# Patient Record
Sex: Male | Born: 1955 | Race: White | Hispanic: No | Marital: Married | Smoking: Former smoker
Health system: Southern US, Community
[De-identification: ages and names within clinical notes are randomized; demographics above are authoritative.]

## PROBLEM LIST (undated history)

## (undated) DIAGNOSIS — D649 Anemia, unspecified: Secondary | ICD-10-CM

## (undated) DIAGNOSIS — I714 Abdominal aortic aneurysm, without rupture, unspecified: Secondary | ICD-10-CM

## (undated) DIAGNOSIS — N189 Chronic kidney disease, unspecified: Secondary | ICD-10-CM

## (undated) DIAGNOSIS — I10 Essential (primary) hypertension: Secondary | ICD-10-CM

## (undated) DIAGNOSIS — A4902 Methicillin resistant Staphylococcus aureus infection, unspecified site: Secondary | ICD-10-CM

## (undated) DIAGNOSIS — I2510 Atherosclerotic heart disease of native coronary artery without angina pectoris: Secondary | ICD-10-CM

## (undated) DIAGNOSIS — G709 Myoneural disorder, unspecified: Secondary | ICD-10-CM

## (undated) DIAGNOSIS — M545 Low back pain, unspecified: Secondary | ICD-10-CM

## (undated) DIAGNOSIS — M5430 Sciatica, unspecified side: Secondary | ICD-10-CM

## (undated) DIAGNOSIS — I509 Heart failure, unspecified: Secondary | ICD-10-CM

## (undated) DIAGNOSIS — Z8719 Personal history of other diseases of the digestive system: Secondary | ICD-10-CM

## (undated) DIAGNOSIS — I639 Cerebral infarction, unspecified: Secondary | ICD-10-CM

## (undated) DIAGNOSIS — F419 Anxiety disorder, unspecified: Secondary | ICD-10-CM

## (undated) DIAGNOSIS — R011 Cardiac murmur, unspecified: Secondary | ICD-10-CM

## (undated) DIAGNOSIS — I739 Peripheral vascular disease, unspecified: Secondary | ICD-10-CM

## (undated) DIAGNOSIS — J189 Pneumonia, unspecified organism: Secondary | ICD-10-CM

## (undated) HISTORY — DX: Abdominal aortic aneurysm, without rupture, unspecified: I71.40

## (undated) HISTORY — DX: Anemia, unspecified: D64.9

## (undated) HISTORY — PX: CARDIAC CATHETERIZATION: SHX172

## (undated) HISTORY — DX: Essential (primary) hypertension: I10

## (undated) HISTORY — PX: OTHER SURGICAL HISTORY: SHX169

## (undated) HISTORY — DX: Abdominal aortic aneurysm, without rupture: I71.4

---

## 1998-04-03 DIAGNOSIS — I639 Cerebral infarction, unspecified: Secondary | ICD-10-CM

## 1998-04-03 HISTORY — DX: Cerebral infarction, unspecified: I63.9

## 1998-04-03 HISTORY — PX: CAROTID ENDARTERECTOMY: SUR193

## 2000-04-03 HISTORY — PX: FEMORAL ARTERY STENT: SHX1583

## 2000-10-29 ENCOUNTER — Ambulatory Visit: Admission: RE | Admit: 2000-10-29 | Discharge: 2000-10-29 | Payer: Self-pay | Admitting: *Deleted

## 2000-10-29 ENCOUNTER — Encounter: Payer: Self-pay | Admitting: *Deleted

## 2000-11-01 ENCOUNTER — Ambulatory Visit (HOSPITAL_COMMUNITY): Admission: RE | Admit: 2000-11-01 | Discharge: 2000-11-01 | Payer: Self-pay | Admitting: *Deleted

## 2000-11-01 ENCOUNTER — Encounter: Payer: Self-pay | Admitting: *Deleted

## 2000-11-21 ENCOUNTER — Ambulatory Visit (HOSPITAL_COMMUNITY): Admission: RE | Admit: 2000-11-21 | Discharge: 2000-11-22 | Payer: Self-pay | Admitting: *Deleted

## 2003-07-28 ENCOUNTER — Inpatient Hospital Stay (HOSPITAL_COMMUNITY): Admission: EM | Admit: 2003-07-28 | Discharge: 2003-07-31 | Payer: Self-pay | Admitting: Emergency Medicine

## 2005-04-03 DIAGNOSIS — I2510 Atherosclerotic heart disease of native coronary artery without angina pectoris: Secondary | ICD-10-CM

## 2005-04-03 HISTORY — DX: Atherosclerotic heart disease of native coronary artery without angina pectoris: I25.10

## 2006-04-03 HISTORY — PX: CORONARY ARTERY BYPASS GRAFT: SHX141

## 2009-04-03 DIAGNOSIS — A4902 Methicillin resistant Staphylococcus aureus infection, unspecified site: Secondary | ICD-10-CM

## 2009-04-03 HISTORY — DX: Methicillin resistant Staphylococcus aureus infection, unspecified site: A49.02

## 2010-11-17 ENCOUNTER — Encounter: Payer: Self-pay | Admitting: Vascular Surgery

## 2010-12-07 ENCOUNTER — Encounter: Payer: Self-pay | Admitting: Vascular Surgery

## 2011-02-08 ENCOUNTER — Other Ambulatory Visit: Payer: Self-pay

## 2011-02-08 ENCOUNTER — Encounter: Payer: Self-pay | Admitting: Vascular Surgery

## 2011-03-23 ENCOUNTER — Encounter: Payer: Self-pay | Admitting: Vascular Surgery

## 2011-03-24 ENCOUNTER — Ambulatory Visit (INDEPENDENT_AMBULATORY_CARE_PROVIDER_SITE_OTHER): Payer: Medicare Other | Admitting: Vascular Surgery

## 2011-03-24 ENCOUNTER — Other Ambulatory Visit (INDEPENDENT_AMBULATORY_CARE_PROVIDER_SITE_OTHER): Payer: Medicare Other | Admitting: *Deleted

## 2011-03-24 ENCOUNTER — Ambulatory Visit (INDEPENDENT_AMBULATORY_CARE_PROVIDER_SITE_OTHER): Payer: Medicare Other | Admitting: *Deleted

## 2011-03-24 ENCOUNTER — Encounter: Payer: Self-pay | Admitting: Vascular Surgery

## 2011-03-24 VITALS — BP 113/69 | HR 58 | Resp 16 | Ht 71.0 in | Wt 225.0 lb

## 2011-03-24 DIAGNOSIS — R9430 Abnormal result of cardiovascular function study, unspecified: Secondary | ICD-10-CM

## 2011-03-24 DIAGNOSIS — I6529 Occlusion and stenosis of unspecified carotid artery: Secondary | ICD-10-CM

## 2011-03-24 DIAGNOSIS — I739 Peripheral vascular disease, unspecified: Secondary | ICD-10-CM

## 2011-03-24 DIAGNOSIS — I70209 Unspecified atherosclerosis of native arteries of extremities, unspecified extremity: Secondary | ICD-10-CM

## 2011-03-24 DIAGNOSIS — I714 Abdominal aortic aneurysm, without rupture, unspecified: Secondary | ICD-10-CM

## 2011-03-24 NOTE — Procedures (Unsigned)
LOWER EXTREMITY ARTERIAL DUPLEX  INDICATION:  Abnormal ankle brachial indices.  HISTORY: Diabetes:  No. Cardiac:  CABG. Hypertension:  Yes. Smoking:  Previous. Previous Surgery:  Patient states a history of left leg stent.  SINGLE LEVEL ARTERIAL EXAM                         RIGHT                LEFT Brachial: Anterior tibial: Posterior tibial: Peroneal: Ankle/Brachial Index:  LOWER EXTREMITY ARTERIAL DUPLEX EXAM  DUPLEX:  Monophasic Doppler waveforms noted throughout the right lower extremity femoral popliteal arterial system with velocities of 379 cm/s and 405 cm/s noted in the right proximal profunda femoral and proximal to mid superficial femoral arteries, respectively.  IMPRESSION: 1. Monophasic waveforms noted throughout the right lower extremity     arterial system with velocities as described above. 2. Bilateral ankle brachial indices are noted on a separate report.  ___________________________________________ Fransisco Hertz, MD  CH/MEDQ  D:  03/24/2011  T:  03/24/2011  Job:  [id number]

## 2011-03-24 NOTE — Progress Notes (Signed)
[facility] [place]  Referred by:  Carlota Raspberry, MD [address] [place], [place] [address]  Reason for referral: R leg pain.  History of Present Illness  Thomas Roth is a 55 y.o. male who presents with chief complaint: R leg pain.  Onset of symptom occurred few months ago.  Pain is described as cramping, severity 3-6/10, and associated with ambulation.  Patient has attempted to treat this pain with rest.  The patient has no rest pain symptoms also and no leg wounds/ulcers.  He has previously has had an iliac artery stented on the left.  Additionally, he has previously had a R CEA and CABG completed.  Atherosclerotic risk factors include: Hypertension, hyperlipidemia, smoking.    He notes no stroke sx or hx.  He denies visual sx, hemiplegia, facial drooping, expressive or receptive aphasia.  He has known he has a L ICA >80% from his vascular surgeon in [place], but elected not to have him do his CEA.  He also notes he has a known history of AAA.  He currently has no abdominal or back pain.  Past Medical History  Diagnosis Date  . Hypertension   . Anemia   . AAA (abdominal aortic aneurysm)     Past Surgical History  Procedure Date  . Femoral artery stent     left    History   Social History  . Marital Status: Single    Spouse Name: N/A    Number of Children: N/A  . Years of Education: N/A   Occupational History  . Not on file.   Social History Main Topics  . Smoking status: Former Smoker    Types: Cigarettes    Quit date: 09/02/2010  . Smokeless tobacco: Not on file  . Alcohol Use:   . Drug Use:   . Sexually Active:    Other Topics Concern  . Not on file   Social History Narrative  . No narrative on file    No family history on file.  Current Outpatient Prescriptions on File Prior to Visit  Medication Sig Dispense Refill  . ALPRAZolam (XANAX) 0.5 MG tablet Take 0.5 mg by mouth 2 (two) times daily.        Marland Kitchen amLODipine  (NORVASC) 10 MG tablet Take 10 mg by mouth daily.        Marland Kitchen aspirin 325 MG EC tablet Take 325 mg by mouth daily.        . benazepril (LOTENSIN) 20 MG tablet Take 20 mg by mouth daily.        . methadone (DOLOPHINE) 10 MG tablet Take 10 mg by mouth every 4 (four) hours as needed.        . metoprolol succinate (TOPROL-XL) 25 MG 24 hr tablet Take 25 mg by mouth daily.        Marland Kitchen zolpidem (AMBIEN) 10 MG tablet Take 10 mg by mouth at bedtime as needed.        . cyclobenzaprine (FLEXERIL) 5 MG tablet Take 5 mg by mouth as needed.        . nitroGLYCERIN (NITROSTAT) 0.4 MG SL tablet Place 0.4 mg under the tongue as needed.        . pravastatin (PRAVACHOL) 10 MG tablet Take 10 mg by mouth daily.        Marland Kitchen triamterene-hydrochlorothiazide (DYAZIDE) 37.5-25 MG per capsule Take 1 capsule by mouth every Monday, Wednesday, and Friday.          No  Known Allergies   Review of Systems (Positive items checked otherwise negative)  General: [ ]  Weight loss, [ ]  Weight gain, [ ]   Loss of appetite, [ ]  Fever  Neurologic: [x]  Dizziness, [ ]  Blackouts, [ ]  Headaches, [ ]  Seizure  Ear/Nose/Throat: [ ]  Change in eyesight, [ ]  Change in hearing, [ ]  Nose bleeds, [ ]  Sore throat  Vascular: [x]  Pain in legs with walking, [x]  Pain in feet while lying flat, [ ]  Non-healing ulcer, [x]  Stroke, [ ]  "Mini stroke", [ ]  Slurred speech, [ ]  Temporary blindness, [ ]  Blood clot in vein, [ ]  Phlebitis  Pulmonary: [ ]  Home oxygen, [ ]  Productive cough, [ ]  Bronchitis, [ ]  Coughing up blood, [ ]  Asthma, [ ]  Wheezing  Musculoskeletal: [ ]  Arthritis, [ ]  Joint pain, [ ]  Muscle pain  Cardiac: [ ]  Chest pain, [ ]  Chest tightness/pressure, [ ]  Shortness of breath when lying flat, [x]  Shortness of breath with exertion, [ ]  Palpitations, [ ]  Heart murmur, [ ]  Arrythmia, [ ]  Atrial fibrillation  Hematologic: [ ]  Bleeding problems, [ ]  Clotting disorder, [ ]  Anemia  Psychiatric:  [ ]  Depression, [ ]  Anxiety, [ ]  Attention deficit  disorder  Gastrointestinal:  [ ]  Black stool,[ ]   Blood in stool, [ ]  Peptic ulcer disease, [ ]  Reflux, [ ]  Hiatal hernia, [ ]  Trouble swallowing, [ ]  Diarrhea, [ ]  Constipation  Urinary:  [ ]  Kidney disease, [ ]  Burning with urination, [ ]  Frequent urination, [ ]  Difficulty urinating  Skin: [ ]  Ulcers, [ ]  Rashes   Physical Examination  Filed Vitals:   03/24/11 1145 03/24/11 1159  BP: 133/74 113/69  Pulse: 62 58  Resp: 16   Height: 5\' 11"  (1.803 m)   Weight: 225 lb (102.059 kg)   SpO2: 98% 97%   Body mass index is 31.38 kg/(m^2).   General: A&O x 3, WDWN, obese  Head: [place]/AT  Ear/Nose/Throat: Hearing grossly intact, nares w/o erythema or drainage, oropharynx w/o Erythema/Exudate  Eyes: PERRLA, EOMI  Neck: Supple, no nuchal rigidity, no palpable LAD  Pulmonary: Sym exp, good air movt, CTAB, no rales, rhonchi, & wheezing  Cardiac: RRR, Nl S1, S2, no Murmurs, rubs or gallops  Vascular: Vessel Right Left  Radial Palpable Palpable  Brachial Palpable Palpable  Carotid Palpable, with bruit Palpable, without bruit  Aorta Non-palpable N/A  Femoral Non-Palpable Non-Palpable  Popliteal Non-palpable Non-palpable  PT Non-Palpable Non-Palpable  DP Non-Palpable Non-Palpable   Gastrointestinal: soft, NTND, -G/R, - HSM, - masses, - CVAT B, obese  Musculoskeletal: M/S 5/5 throughout , Extremities without ischemic changes except some dependent rubor in both feet, some mild CVI changes in both legs  Neurologic: CN 2-12 intact , Pain and light touch intact in extremities , Motor exam as listed above  Psychiatric: Judgment intact, Mood & affect appropriate for pt's clinical situation  Dermatologic: See M/S exam for extremity exam, no rashes otherwise noted  Lymph : No Cervical, Axillary, or Inguinal lymphadenopathy   Non-Invasive Vascular Imaging  CAROTID DUPLEX (Date: 03/24/11):   R ICA stenosis: patent  R VA: abnormal  R proximal SCA  PSV 319 c/s  L ICA stenosis:  80-99%  L VA:  patent and antegrade  ABI (Date: 03/24/11)  RLE: 0.70, PT: monophasic, AT: biphasic  LLE: 1.02, PT: monophasic, AT: biphasic  R leg arterial duplex (Date: 03/24/11)  Monophasic waveforms throughout R femoropopliteal system, PSV 379 c/s in prox. PFA and 405 c/s prox.  SFA  Outside Studies/Documentation 10 pages of outside documents were reviewed including: BLE arterial studies, AAA ultrasound.  Medical Decision Making  KARTIER BENNISON is a 55 y.o. male who presents with: Asx L ICA stenosis, BLE intermittent claudication, and small AAA   Based on ACAS, he will need a L CEA.  Given his significant cardiac risks, he need preoperative cardiology evaluation prior to proceeding.  He will follow up in 2 weeks after his cardiac workup is completed.   I discussed with the patient the risks, benefits, and alternatives to carotid endarterectomy.  I discussed the procedural details of carotid endarterectomy with the patient.  The patient is aware that the risks of carotid endarterectomy include but are not limited to: bleeding, infection, stroke, myocardial infarction, death, cranial nerve injuries both temporary and permanent, neck hematoma, possible airway compromise, labile blood pressure post-operatively, cerebral hyperperfusion syndrome, and possible need for additional interventions in the future. The patient is aware of the risks and agrees to proceed forward with the procedure. After this is addressed then we will do an Aortogram, Bilateral leg runoff, possible intervention on R leg.  The aortogram will interrogate the AAA also.  I discussed with the patient the natural history of intermittent claudication: 75% of patients have stable or improved symptoms in a year an only 2% require amputation. Eventually 20% may require intervention in a year.  I discussed in depth with the patient the nature of atherosclerosis, and emphasized the importance of maximal medical management  including strict control of blood pressure, blood glucose, and lipid levels, obtaining regular exercise, and cessation of smoking.  The patient is aware that without maximal medical management the underlying atherosclerotic disease process will progress, limiting the benefit of any interventions.  I discussed in depth with the patient a walking plan and how to execute such.  The patient is not interested in starting Pletal.  Thank you for allowing [place] to participate in this patient's care.  Leonides Sake, MD [facility] Office: [phone] Pager: [phone]  03/24/2011, 5:43 PM

## 2011-04-03 NOTE — Procedures (Unsigned)
CAROTID DUPLEX EXAM  INDICATION:  Carotid disease.  HISTORY: Diabetes:  No. Cardiac:  CABG. Hypertension:  Yes. Smoking:  Previous. Previous Surgery:  History of right carotid endarterectomy. CV History:  Currently asymptomatic. Amaurosis Fugax No, Paresthesias No, Hemiparesis No.                                      RIGHT             LEFT Brachial systolic pressure:         105               128 Brachial Doppler waveforms:         Abnormal          Normal Vertebral direction of flow:        Antegrade         Not visualized DUPLEX VELOCITIES (cm/sec) CCA peak systolic                   142               78 ECA peak systolic                   225               130 ICA peak systolic                   111               446 ICA end diastolic                   27                146 PLAQUE MORPHOLOGY:                  Heterogenous      Mixed PLAQUE AMOUNT:                      Mild              Severe PLAQUE LOCATION:                    ICA/ECA           ICA/ECA/CCA  IMPRESSION: 1. Patent right carotid endarterectomy site with no hemodynamically     significant stenosis of the right internal carotid artery.  Right     external carotid artery stenosis noted. 2. Doppler velocities suggest an 80% to 99% stenosis of the left     proximal internal carotid artery. 3. Significant difference in the bilateral brachial pressures with a     velocity of 319 cm/s noted in the right proximal subclavian artery.  ___________________________________________ Fransisco Hertz, MD  CH/MEDQ  D:  03/24/2011  T:  03/24/2011  Job:  [id number]

## 2011-04-06 ENCOUNTER — Encounter: Payer: Self-pay | Admitting: Vascular Surgery

## 2011-04-07 ENCOUNTER — Encounter: Payer: Self-pay | Admitting: Vascular Surgery

## 2011-04-07 ENCOUNTER — Ambulatory Visit (INDEPENDENT_AMBULATORY_CARE_PROVIDER_SITE_OTHER): Payer: Medicare Other | Admitting: Vascular Surgery

## 2011-04-07 ENCOUNTER — Encounter: Payer: Self-pay | Admitting: *Deleted

## 2011-04-07 ENCOUNTER — Other Ambulatory Visit: Payer: Self-pay | Admitting: *Deleted

## 2011-04-07 VITALS — BP 144/75 | HR 66 | Resp 16 | Ht 71.0 in | Wt 222.0 lb

## 2011-04-07 DIAGNOSIS — I6529 Occlusion and stenosis of unspecified carotid artery: Secondary | ICD-10-CM | POA: Insufficient documentation

## 2011-04-07 DIAGNOSIS — I70219 Atherosclerosis of native arteries of extremities with intermittent claudication, unspecified extremity: Secondary | ICD-10-CM | POA: Insufficient documentation

## 2011-04-07 NOTE — Progress Notes (Signed)
[facility]  Established Carotid Patient  History of Present Illness  Thomas Roth is a 56 y.o. male who presents with chief complaint: follow-up preop cardiac clearance.  Patient has had no history of TIA or stroke symptom.  By report, he is cleared to proceed with L CEA.  Past Medical History, Past Surgical History, Social History, Family History, Medications, Allergies, and Review of Systems are unchanged from previous evaluation on 03/24/11.  Physical Examination  Filed Vitals:   04/07/11 1323  BP: 144/75  Pulse: 66  Resp: 16  Height: 5\' 11"  (1.803 m)  Weight: 222 lb (100.699 kg)  SpO2: 99%   Body mass index is 30.96 kg/(m^2).  General: A&O x 3, WDWN  Eyes: PERRLA, EOMI  Pulmonary: Sym exp, good air movt, CTAB, no rales, rhonchi, & wheezing  Cardiac: RRR, Nl S1, S2, no Murmurs, rubs or gallops  Gastrointestinal: soft, NTND, -G/R, - HSM, - masses, - CVAT B  Musculoskeletal: M/S 5/5 throughout , Extremities without ischemic changes   Neurologic: CN 2-12 intact , Pain and light touch intact in extremities , Motor exam as listed above  Medical Decision Making  [name] is a 56 y.o. male who presents with: asx L ICA stenosis > 80%   Based on the patient's vascular studies and examination, I have offered the patient: L CEA, scheduled for 04/17/11. I discussed with the patient the risks, benefits, and alternatives to carotid endarterectomy.  The patient is a candidate for carotid artery stenting, which I do not recommend as the patient is good candidate for CEA. I discussed the procedural details of carotid endarterectomy with the patient. The patient is aware that the risks of carotid endarterectomy include but are not limited to: bleeding, infection, stroke, myocardial infarction, death, cranial nerve injuries both temporary and permanent, neck hematoma, possible airway compromise, labile blood pressure post-operatively, and cerebral  hyperperfusion syndrome.  I discussed in depth with the patient the nature of atherosclerosis, and emphasized the importance of maximal medical management including strict control of blood pressure, blood glucose, and lipid levels, obtaining regular exercise, antiplatelet agent use, and cessation of smoking.  The patient is aware that without maximal medical management the underlying atherosclerotic disease process will progress, limiting the benefit of any interventions.  Thank you for allowing [place] to participate in this patient's care.  Leonides Sake, MD [facility] Office: [phone] Pager: [phone]  04/07/2011, 2:36 PM

## 2011-04-10 ENCOUNTER — Encounter (HOSPITAL_COMMUNITY): Payer: Self-pay | Admitting: Pharmacy Technician

## 2011-04-13 ENCOUNTER — Encounter (HOSPITAL_COMMUNITY): Payer: Self-pay | Admitting: *Deleted

## 2011-04-13 NOTE — Progress Notes (Signed)
Thomas Roth stated that he is for a cardiac cath at [facility]  on 1/11.  "If I have a stent I will not have carotid surgery until later."  " I have spoken to my cardiologist and short stay and they re making arrangements to send the labs I had drawn and chest x-ray results to [facility]"

## 2011-04-14 NOTE — Consult Note (Addendum)
Anesthesia:  Patient is a 56 year old male scheduled for a left CEA on 02/15/12.  He will be a same day work-up. Dr. Imogene Burn did, however, have him see his Cardiologist Dr. Daryel November in [place], [place] for a preoperative evaluation.   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  He had an abnormal stress test on 04/07/11 that showed inferior hypoperfusion, mild reversibility, and EF 57%.   He subsequently had a cardiac cath done on 04/14/11, but the formal report was not yet available to be faxed on 04/14/11.  Dr. Hyacinth Meeker ultimately cleared him for surgery, see faxed note.  He did not send an EKG, so one will need to be done on the day of surgery.  He had a CXR on 04/12/11 that showed evidence of prior CABG, cardiac and mediastinal silhouettes unremarkable, elevation of the left hemidiaphragm which was felt stable, linear densities in the left lung base, ATX or scarring.  The lungs were otherwise clear and negative for PTX or pleural effusion.    He also had a CBC, BMET, and PT/PTT in [place].  Since he is s/p cath he will need repeat metabolic panel preoperatively.  Oswaldo Done, RN at [facility] has already entered for him to have repeat labs on the day of surgery including a CMET, CBC, T&S and UA.

## 2011-04-16 MED ORDER — SODIUM CHLORIDE 0.9 % IV SOLN: INTRAVENOUS | Status: DC

## 2011-04-16 MED ORDER — DEXTROSE 5 % IV SOLN
1.5000 g | INTRAVENOUS | Status: AC
  Administered 2011-04-17: 1.5 g via INTRAVENOUS
  Filled 2011-04-16: qty 1.5

## 2011-04-17 ENCOUNTER — Other Ambulatory Visit: Payer: Self-pay

## 2011-04-17 ENCOUNTER — Ambulatory Visit (HOSPITAL_COMMUNITY): Payer: Medicare Other | Admitting: Vascular Surgery

## 2011-04-17 ENCOUNTER — Encounter (HOSPITAL_COMMUNITY): Payer: Self-pay | Admitting: Vascular Surgery

## 2011-04-17 ENCOUNTER — Other Ambulatory Visit: Payer: Self-pay | Admitting: Vascular Surgery

## 2011-04-17 ENCOUNTER — Encounter (HOSPITAL_COMMUNITY): Payer: Self-pay | Admitting: *Deleted

## 2011-04-17 ENCOUNTER — Inpatient Hospital Stay (HOSPITAL_COMMUNITY)
Admission: RE | Admit: 2011-04-17 | Discharge: 2011-04-18 | DRG: 039 | Disposition: A | Discharge status: Home / Self Care | Payer: Medicare Other | Source: Ambulatory Visit | Attending: Vascular Surgery | Admitting: Vascular Surgery

## 2011-04-17 ENCOUNTER — Encounter (HOSPITAL_COMMUNITY)
Admission: RE | Disposition: A | Discharge status: Home / Self Care | Payer: Self-pay | Source: Ambulatory Visit | Attending: Vascular Surgery

## 2011-04-17 DIAGNOSIS — I714 Abdominal aortic aneurysm, without rupture, unspecified: Secondary | ICD-10-CM | POA: Diagnosis present

## 2011-04-17 DIAGNOSIS — I129 Hypertensive chronic kidney disease with stage 1 through stage 4 chronic kidney disease, or unspecified chronic kidney disease: Secondary | ICD-10-CM | POA: Diagnosis present

## 2011-04-17 DIAGNOSIS — F17200 Nicotine dependence, unspecified, uncomplicated: Secondary | ICD-10-CM | POA: Diagnosis present

## 2011-04-17 DIAGNOSIS — F411 Generalized anxiety disorder: Secondary | ICD-10-CM | POA: Diagnosis present

## 2011-04-17 DIAGNOSIS — I509 Heart failure, unspecified: Secondary | ICD-10-CM | POA: Diagnosis present

## 2011-04-17 DIAGNOSIS — I70219 Atherosclerosis of native arteries of extremities with intermittent claudication, unspecified extremity: Secondary | ICD-10-CM | POA: Diagnosis present

## 2011-04-17 DIAGNOSIS — Z951 Presence of aortocoronary bypass graft: Secondary | ICD-10-CM

## 2011-04-17 DIAGNOSIS — I2510 Atherosclerotic heart disease of native coronary artery without angina pectoris: Secondary | ICD-10-CM | POA: Diagnosis present

## 2011-04-17 DIAGNOSIS — Z8614 Personal history of Methicillin resistant Staphylococcus aureus infection: Secondary | ICD-10-CM

## 2011-04-17 DIAGNOSIS — N189 Chronic kidney disease, unspecified: Secondary | ICD-10-CM | POA: Diagnosis present

## 2011-04-17 DIAGNOSIS — G589 Mononeuropathy, unspecified: Secondary | ICD-10-CM | POA: Diagnosis present

## 2011-04-17 DIAGNOSIS — I6529 Occlusion and stenosis of unspecified carotid artery: Secondary | ICD-10-CM

## 2011-04-17 DIAGNOSIS — Z7982 Long term (current) use of aspirin: Secondary | ICD-10-CM

## 2011-04-17 DIAGNOSIS — Z8673 Personal history of transient ischemic attack (TIA), and cerebral infarction without residual deficits: Secondary | ICD-10-CM

## 2011-04-17 DIAGNOSIS — Z79899 Other long term (current) drug therapy: Secondary | ICD-10-CM

## 2011-04-17 DIAGNOSIS — E785 Hyperlipidemia, unspecified: Secondary | ICD-10-CM | POA: Diagnosis present

## 2011-04-17 DIAGNOSIS — Z23 Encounter for immunization: Secondary | ICD-10-CM

## 2011-04-17 HISTORY — DX: Sciatica, unspecified side: M54.30

## 2011-04-17 HISTORY — DX: Low back pain, unspecified: M54.50

## 2011-04-17 HISTORY — DX: Pneumonia, unspecified organism: J18.9

## 2011-04-17 HISTORY — DX: Cardiac murmur, unspecified: R01.1

## 2011-04-17 HISTORY — DX: Cerebral infarction, unspecified: I63.9

## 2011-04-17 HISTORY — DX: Methicillin resistant Staphylococcus aureus infection, unspecified site: A49.02

## 2011-04-17 HISTORY — DX: Personal history of other diseases of the digestive system: Z87.19

## 2011-04-17 HISTORY — DX: Low back pain: M54.5

## 2011-04-17 HISTORY — PX: ENDARTERECTOMY: SHX5162

## 2011-04-17 HISTORY — DX: Anxiety disorder, unspecified: F41.9

## 2011-04-17 HISTORY — DX: Heart failure, unspecified: I50.9

## 2011-04-17 HISTORY — DX: Atherosclerotic heart disease of native coronary artery without angina pectoris: I25.10

## 2011-04-17 HISTORY — DX: Peripheral vascular disease, unspecified: I73.9

## 2011-04-17 HISTORY — DX: Myoneural disorder, unspecified: G70.9

## 2011-04-17 HISTORY — DX: Chronic kidney disease, unspecified: N18.9

## 2011-04-17 LAB — COMPREHENSIVE METABOLIC PANEL
Albumin: 3 g/dL — ABNORMAL LOW (ref 3.5–5.2)
Alkaline Phosphatase: 101 U/L (ref 39–117)
BUN: 19 mg/dL (ref 6–23)
Chloride: 100 mEq/L (ref 96–112)
Creatinine, Ser: 1.24 mg/dL (ref 0.50–1.35)
GFR calc Af Amer: 73 mL/min — ABNORMAL LOW (ref >90)
Glucose, Bld: 169 mg/dL — ABNORMAL HIGH (ref 70–99)
Total Bilirubin: 0.4 mg/dL (ref 0.3–1.2)
Total Protein: 6.1 g/dL (ref 6.0–8.3)

## 2011-04-17 LAB — CBC
MCH: 28.8 pg (ref 26.0–34.0)
MCHC: 33 g/dL (ref 30.0–36.0)
Platelets: 208 10*3/uL (ref 150–400)
RBC: 3.96 MIL/uL — ABNORMAL LOW (ref 4.22–5.81)
RDW: 13.8 % (ref 11.5–15.5)

## 2011-04-17 LAB — TYPE AND SCREEN
ABO/RH(D): O NEG
Antibody Screen: NEGATIVE

## 2011-04-17 LAB — CREATININE, SERUM: Creatinine, Ser: 1.06 mg/dL (ref 0.50–1.35)

## 2011-04-17 LAB — SURGICAL PCR SCREEN: Staphylococcus aureus: NEGATIVE

## 2011-04-17 SURGERY — ENDARTERECTOMY, CAROTID
Anesthesia: General | Site: Neck | Laterality: Left | Wound class: Clean

## 2011-04-17 MED ORDER — PHENYLEPHRINE HCL 10 MG/ML IJ SOLN
10.0000 mg | INTRAMUSCULAR | Status: DC | PRN
  Administered 2011-04-17: 15 ug/min via INTRAVENOUS

## 2011-04-17 MED ORDER — DOPAMINE-DEXTROSE 3.2-5 MG/ML-% IV SOLN
3.0000 ug/kg/min | INTRAVENOUS | Status: AC
  Filled 2011-04-17: qty 250

## 2011-04-17 MED ORDER — BENAZEPRIL HCL 20 MG PO TABS
20.0000 mg | ORAL_TABLET | Freq: Every day | ORAL | Status: DC
  Administered 2011-04-18: 20 mg via ORAL
  Filled 2011-04-17 (×2): qty 1

## 2011-04-17 MED ORDER — ACETAMINOPHEN 650 MG RE SUPP: 325.0000 mg | RECTAL | Status: DC | PRN

## 2011-04-17 MED ORDER — MAGNESIUM SULFATE 40 MG/ML IJ SOLN
2.0000 g | Freq: Once | INTRAMUSCULAR | Status: AC | PRN
  Filled 2011-04-17: qty 50

## 2011-04-17 MED ORDER — 0.9 % SODIUM CHLORIDE (POUR BTL) OPTIME
TOPICAL | Status: DC | PRN
  Administered 2011-04-17: 1000 mL

## 2011-04-17 MED ORDER — GUAIFENESIN-DM 100-10 MG/5ML PO SYRP: 15.0000 mL | ORAL_SOLUTION | ORAL | Status: DC | PRN

## 2011-04-17 MED ORDER — ASPIRIN EC 325 MG PO TBEC: 325.0000 mg | DELAYED_RELEASE_TABLET | Freq: Every day | ORAL | Status: DC

## 2011-04-17 MED ORDER — ZOLPIDEM TARTRATE 5 MG PO TABS: 10.0000 mg | ORAL_TABLET | Freq: Every evening | ORAL | Status: DC | PRN

## 2011-04-17 MED ORDER — ACETAMINOPHEN 325 MG PO TABS: 325.0000 mg | ORAL_TABLET | ORAL | Status: DC | PRN

## 2011-04-17 MED ORDER — DOPAMINE-DEXTROSE 3.2-5 MG/ML-% IV SOLN
3.0000 ug/kg/min | INTRAVENOUS | Status: DC
  Administered 2011-04-17: 3 ug/kg/min via INTRAVENOUS

## 2011-04-17 MED ORDER — DEXTROSE 5 % IV SOLN
1.5000 g | Freq: Two times a day (BID) | INTRAVENOUS | Status: AC
  Administered 2011-04-17 – 2011-04-18 (×2): 1.5 g via INTRAVENOUS
  Filled 2011-04-17 (×2): qty 1.5

## 2011-04-17 MED ORDER — FENTANYL CITRATE 0.05 MG/ML IJ SOLN
INTRAMUSCULAR | Status: DC | PRN
  Administered 2011-04-17: 150 ug via INTRAVENOUS

## 2011-04-17 MED ORDER — PROTAMINE SULFATE 10 MG/ML IV SOLN
INTRAVENOUS | Status: DC | PRN
  Administered 2011-04-17: 20 mg via INTRAVENOUS
  Administered 2011-04-17: 10 mg via INTRAVENOUS

## 2011-04-17 MED ORDER — SODIUM CHLORIDE 0.9 % IR SOLN
Status: DC | PRN
  Administered 2011-04-17: 08:00:00

## 2011-04-17 MED ORDER — HYDRALAZINE HCL 20 MG/ML IJ SOLN
10.0000 mg | INTRAMUSCULAR | Status: DC | PRN
  Filled 2011-04-17: qty 0.5

## 2011-04-17 MED ORDER — NEOSTIGMINE METHYLSULFATE 1 MG/ML IJ SOLN
INTRAMUSCULAR | Status: DC | PRN
  Administered 2011-04-17: 2 mg via INTRAVENOUS

## 2011-04-17 MED ORDER — LABETALOL HCL 5 MG/ML IV SOLN: 10.0000 mg | INTRAVENOUS | Status: DC | PRN

## 2011-04-17 MED ORDER — POTASSIUM CHLORIDE CRYS ER 20 MEQ PO TBCR: 20.0000 meq | EXTENDED_RELEASE_TABLET | Freq: Once | ORAL | Status: AC | PRN

## 2011-04-17 MED ORDER — SODIUM CHLORIDE 0.9 % IV SOLN
500.0000 mL | Freq: Once | INTRAVENOUS | Status: AC | PRN
  Administered 2011-04-17: 500 mL via INTRAVENOUS

## 2011-04-17 MED ORDER — ASPIRIN EC 325 MG PO TBEC
325.0000 mg | DELAYED_RELEASE_TABLET | Freq: Every day | ORAL | Status: DC
  Administered 2011-04-17 – 2011-04-18 (×2): 325 mg via ORAL
  Filled 2011-04-17 (×2): qty 1

## 2011-04-17 MED ORDER — GLYCOPYRROLATE 0.2 MG/ML IJ SOLN
INTRAMUSCULAR | Status: DC | PRN
  Administered 2011-04-17: .4 mg via INTRAVENOUS
  Administered 2011-04-17: 0.2 mg via INTRAVENOUS

## 2011-04-17 MED ORDER — MORPHINE SULFATE 4 MG/ML IJ SOLN
INTRAMUSCULAR | Status: AC
  Administered 2011-04-17: 3 mg via INTRAVENOUS
  Filled 2011-04-17: qty 1

## 2011-04-17 MED ORDER — DOCUSATE SODIUM 100 MG PO CAPS
100.0000 mg | ORAL_CAPSULE | Freq: Every day | ORAL | Status: DC
Start: 2011-04-18 — End: 2011-04-18
  Administered 2011-04-18: 100 mg via ORAL
  Filled 2011-04-17: qty 1

## 2011-04-17 MED ORDER — ENOXAPARIN SODIUM 40 MG/0.4ML ~~LOC~~ SOLN
40.0000 mg | SUBCUTANEOUS | Status: DC
  Administered 2011-04-17: 40 mg via SUBCUTANEOUS
  Filled 2011-04-17 (×2): qty 0.4

## 2011-04-17 MED ORDER — HEPARIN SODIUM (PORCINE) 1000 UNIT/ML IJ SOLN
INTRAMUSCULAR | Status: DC | PRN
  Administered 2011-04-17: 8000 [IU] via INTRAVENOUS

## 2011-04-17 MED ORDER — VECURONIUM BROMIDE 10 MG IV SOLR
INTRAVENOUS | Status: DC | PRN
  Administered 2011-04-17: 8 mg via INTRAVENOUS

## 2011-04-17 MED ORDER — PHENOL 1.4 % MT LIQD: 1.0000 | OROMUCOSAL | Status: DC | PRN

## 2011-04-17 MED ORDER — OXYCODONE HCL 5 MG PO TABS: 5.0000 mg | ORAL_TABLET | ORAL | Status: DC | PRN

## 2011-04-17 MED ORDER — THROMBIN 20000 UNITS EX KIT
PACK | OROMUCOSAL | Status: DC | PRN
  Administered 2011-04-17: 10:00:00 via TOPICAL

## 2011-04-17 MED ORDER — DEXTRAN 40 IN SALINE 10-0.9 % IV SOLN
10.0000 mL/kg | INTRAVENOUS | Status: DC
  Filled 2011-04-17: qty 1000

## 2011-04-17 MED ORDER — ONDANSETRON HCL 4 MG/2ML IJ SOLN
INTRAMUSCULAR | Status: DC | PRN
  Administered 2011-04-17: 4 mg via INTRAVENOUS

## 2011-04-17 MED ORDER — DEXTRAN 40 IN SALINE 10-0.9 % IV SOLN
INTRAVENOUS | Status: DC | PRN
  Administered 2011-04-17: 500 mL

## 2011-04-17 MED ORDER — ALPRAZOLAM 0.5 MG PO TABS
0.5000 mg | ORAL_TABLET | Freq: Two times a day (BID) | ORAL | Status: DC
  Administered 2011-04-17 – 2011-04-18 (×2): 0.5 mg via ORAL
  Filled 2011-04-17 (×2): qty 1

## 2011-04-17 MED ORDER — AMLODIPINE BESYLATE 10 MG PO TABS
10.0000 mg | ORAL_TABLET | Freq: Every day | ORAL | Status: DC
  Administered 2011-04-18: 10 mg via ORAL
  Filled 2011-04-17 (×2): qty 1

## 2011-04-17 MED ORDER — LACTATED RINGERS IV SOLN
INTRAVENOUS | Status: DC | PRN
  Administered 2011-04-17 (×2): via INTRAVENOUS

## 2011-04-17 MED ORDER — LIDOCAINE HCL (PF) 1 % IJ SOLN
INTRAMUSCULAR | Status: DC | PRN
  Administered 2011-04-17: 20 mL

## 2011-04-17 MED ORDER — ONDANSETRON HCL 4 MG/2ML IJ SOLN: 4.0000 mg | Freq: Four times a day (QID) | INTRAMUSCULAR | Status: DC | PRN

## 2011-04-17 MED ORDER — MORPHINE SULFATE 4 MG/ML IJ SOLN
3.0000 mg | INTRAMUSCULAR | Status: DC | PRN
  Administered 2011-04-17: 3 mg via INTRAVENOUS

## 2011-04-17 MED ORDER — METOPROLOL SUCCINATE ER 25 MG PO TB24
25.0000 mg | ORAL_TABLET | Freq: Two times a day (BID) | ORAL | Status: DC
  Administered 2011-04-18: 25 mg via ORAL
  Filled 2011-04-17 (×3): qty 1

## 2011-04-17 MED ORDER — PROPOFOL 10 MG/ML IV EMUL
INTRAVENOUS | Status: DC | PRN
  Administered 2011-04-17: 150 mg via INTRAVENOUS
  Administered 2011-04-17: 50 mg via INTRAVENOUS

## 2011-04-17 MED ORDER — METHADONE HCL 10 MG PO TABS
20.0000 mg | ORAL_TABLET | Freq: Every day | ORAL | Status: DC
  Administered 2011-04-17 – 2011-04-18 (×4): 20 mg via ORAL
  Filled 2011-04-17 (×5): qty 2

## 2011-04-17 MED ORDER — MUPIROCIN 2 % EX OINT
TOPICAL_OINTMENT | CUTANEOUS | Status: AC
  Filled 2011-04-17: qty 22

## 2011-04-17 MED ORDER — HYDROMORPHONE HCL PF 1 MG/ML IJ SOLN: 0.2500 mg | INTRAMUSCULAR | Status: DC | PRN

## 2011-04-17 MED ORDER — SODIUM CHLORIDE 0.9 % IV SOLN
INTRAVENOUS | Status: DC
  Administered 2011-04-17: 15:00:00 via INTRAVENOUS

## 2011-04-17 MED ORDER — NITROGLYCERIN 0.4 MG SL SUBL: 0.4000 mg | SUBLINGUAL_TABLET | SUBLINGUAL | Status: DC | PRN

## 2011-04-17 MED ORDER — METOPROLOL TARTRATE 1 MG/ML IV SOLN: 2.0000 mg | INTRAVENOUS | Status: DC | PRN

## 2011-04-17 SURGICAL SUPPLY — 54 items
BAG DECANTER FOR FLEXI CONT (MISCELLANEOUS) ×2 IMPLANT
CANISTER SUCTION 2500CC (MISCELLANEOUS) ×2 IMPLANT
CATH ROBINSON RED A/P 18FR (CATHETERS) ×2 IMPLANT
CLIP TI MEDIUM 24 (CLIP) ×2 IMPLANT
CLIP TI WIDE RED SMALL 24 (CLIP) ×2 IMPLANT
CLOTH BEACON ORANGE TIMEOUT ST (SAFETY) ×2 IMPLANT
COVER PROBE W GEL 5X96 (DRAPES) ×2 IMPLANT
COVER SURGICAL LIGHT HANDLE (MISCELLANEOUS) ×4 IMPLANT
CRADLE DONUT ADULT HEAD (MISCELLANEOUS) ×2 IMPLANT
DERMABOND ADHESIVE PROPEN (GAUZE/BANDAGES/DRESSINGS) ×1
DERMABOND ADVANCED (GAUZE/BANDAGES/DRESSINGS) ×1
DERMABOND ADVANCED .7 DNX12 (GAUZE/BANDAGES/DRESSINGS) ×1 IMPLANT
DERMABOND ADVANCED .7 DNX6 (GAUZE/BANDAGES/DRESSINGS) ×1 IMPLANT
DRAIN CHANNEL 15F RND FF W/TCR (WOUND CARE) IMPLANT
DRAPE WARM FLUID 44X44 (DRAPE) ×2 IMPLANT
ELECT REM PT RETURN 9FT ADLT (ELECTROSURGICAL) ×2
ELECTRODE REM PT RTRN 9FT ADLT (ELECTROSURGICAL) ×1 IMPLANT
EVACUATOR SILICONE 100CC (DRAIN) IMPLANT
GLOVE BIO SURGEON STRL SZ 6.5 (GLOVE) ×4 IMPLANT
GLOVE BIO SURGEON STRL SZ7 (GLOVE) ×4 IMPLANT
GLOVE BIOGEL PI IND STRL 6.5 (GLOVE) ×4 IMPLANT
GLOVE BIOGEL PI IND STRL 7.0 (GLOVE) ×1 IMPLANT
GLOVE BIOGEL PI IND STRL 7.5 (GLOVE) ×1 IMPLANT
GLOVE BIOGEL PI INDICATOR 6.5 (GLOVE) ×4
GLOVE BIOGEL PI INDICATOR 7.0 (GLOVE) ×1
GLOVE BIOGEL PI INDICATOR 7.5 (GLOVE) ×1
GLOVE SURG SS PI 7.5 STRL IVOR (GLOVE) ×4 IMPLANT
GLOVE SURG SS PI 8.0 STRL IVOR (GLOVE) ×2 IMPLANT
GOWN STRL NON-REIN LRG LVL3 (GOWN DISPOSABLE) ×10 IMPLANT
HEMOSTAT SURGICEL 2X14 (HEMOSTASIS) IMPLANT
KIT BASIN OR (CUSTOM PROCEDURE TRAY) ×2 IMPLANT
KIT ROOM TURNOVER OR (KITS) ×2 IMPLANT
NS IRRIG 1000ML POUR BTL (IV SOLUTION) ×4 IMPLANT
PACK CAROTID (CUSTOM PROCEDURE TRAY) ×2 IMPLANT
PAD ARMBOARD 7.5X6 YLW CONV (MISCELLANEOUS) ×4 IMPLANT
PATCH VASCULAR VASCU GUARD 1X6 (Vascular Products) ×2 IMPLANT
SET COLLECT BLD 21X3/4 12 (NEEDLE) IMPLANT
SHUNT CAROTID BYPASS 10 (VASCULAR PRODUCTS) ×2 IMPLANT
SHUNT CAROTID BYPASS 12FRX15.5 (VASCULAR PRODUCTS) IMPLANT
SPECIMEN JAR SMALL (MISCELLANEOUS) ×2 IMPLANT
SPONGE SURGIFOAM ABS GEL 100 (HEMOSTASIS) ×2 IMPLANT
SUT ETHILON 3 0 PS 1 (SUTURE) ×2 IMPLANT
SUT MNCRL AB 4-0 PS2 18 (SUTURE) ×2 IMPLANT
SUT PROLENE 6 0 BV (SUTURE) ×6 IMPLANT
SUT PROLENE 7 0 BV 1 (SUTURE) IMPLANT
SUT SILK 2 0 FS (SUTURE) IMPLANT
SUT VIC AB 3-0 SH 27 (SUTURE) ×1
SUT VIC AB 3-0 SH 27X BRD (SUTURE) ×1 IMPLANT
SYR TB 1ML LUER SLIP (SYRINGE) IMPLANT
SYSTEM CHEST DRAIN TLS 7FR (DRAIN) ×2 IMPLANT
TOWEL OR 17X24 6PK STRL BLUE (TOWEL DISPOSABLE) ×2 IMPLANT
TOWEL OR 17X26 10 PK STRL BLUE (TOWEL DISPOSABLE) ×2 IMPLANT
TRAY FOLEY CATH 14FRSI W/METER (CATHETERS) ×2 IMPLANT
WATER STERILE IRR 1000ML POUR (IV SOLUTION) ×2 IMPLANT

## 2011-04-17 NOTE — Op Note (Signed)
OPERATIVE NOTE  PROCEDURE:   1.  left carotid endarterectomy with bovine patch angioplasty 2.  intraoperative left carotid ultrasound  PRE-OPERATIVE DIAGNOSIS: left high grade asymptomatic carotid stenosis  POST-OPERATIVE DIAGNOSIS: same as above   SURGEON: Leonides Sake, MD  ASSISTANT(S): Della Goo, [facility]  ANESTHESIA: general  ESTIMATED BLOOD LOSS: 50 cc  FINDING(S): 1.  Continuous Doppler audible flow signatures are appropriate for each carotid artery. 2.  No evidence of intimal flap visualized on transverse or longitudinal ultrasonography. 3.  Soft ulcerated carotid plaque with stenoses in common carotid artery and internal carotid artery  SPECIMEN(S):  Carotid plaque (sent to Pathology)  INDICATIONS:   Thomas Roth is a 56 y.o. male who  presents with left asymptomatic carotid stenosis 80-99%.  I discussed with the patient the risks, benefits, and alternatives to carotid endarterectomy.  I discussed the procedural details of carotid endarterectomy with the patient.  The patient is aware that the risks of carotid endarterectomy include but are not limited to: bleeding, infection, stroke, myocardial infarction, death, cranial nerve injuries both temporary and permanent, neck hematoma, possible airway compromise, labile blood pressure post-operatively, cerebral hyperperfusion syndrome, and possible need for additional interventions in the future. The patient is aware of the risks and agrees to proceed forward with the procedure.  DESCRIPTION: After full informed written consent was obtained from the patient, the patient was brought back to the operating room and placed supine upon the operating table.  Prior to induction, the patient received IV antibiotics.  After obtaining adequate anesthesia, the patient was placed into semi-Fowler position with a shoulder roll in place and the patient's neck slightly hyperextended and rotated away from the surgical site.  The patient was prepped  in the standard fashion for a left carotid endarterectomy.  I made an incision anterior to the sternocleidomastoid muscle and dissected down through the subcutaneous tissue.  The platysmas was opened with electrocautery.  Then I dissected down to the internal jugular vein.  In this process, an anterior jugular vein was crossing the surgical field and had to be ligated.  The left internal jugular vein was dissected posteriorly until I obtained visualization of the common carotid artery.  This was dissected out and then an umbilical tape was placed around the common carotid artery and I loosely applied a Rumel tourniquet.  I then dissected in a periadventitial fashion along the common carotid artery up to the bifurcation.  I then identified the external carotid artery and the superior thyroid artery.  A 2-0 silk tie was looped around the superior thyroid artery, and I also dissected out the external carotid artery and placed a vessel loop around it.  In continuing the dissection to the internal carotid artery, I identified the facial vein.  This was ligated and then transected, giving me improved exposure of the internal carotid artery.  In the process of this dissection, the hypoglossal nerve was identified.  I then dissected out the internal carotid artery until I identified an area of soft tissue in the internal carotid artery.  I dissected slightly distal to this area, and placed an umbilical tape around the artery and loosely applied a Rumel tourniquet.  At this point, we gave the patient a therapeutic bolus of Heparin intravenously (roughly 80 units/kg).  After waiting 3 minutes, then I clamped the internal carotid artery, external carotid artery and then the common carotid artery.  I then made an arteriotomy in the common carotid artery with a 11 blade, and extended the arteriotomy  with a Potts scissor down into the common carotid artery, then I carried the arteriotomy through the bifurcation into the internal  carotid artery until I reached an area that was not diseased.  At this point, I took the 10 shunt that previously been prepared and I inserted it into the internal carotid artery.  The Rumel tourniquet was then applied to this end of the shunt.  I unclamped the shunt to verify retrograde blood flow in the internal carotid artery.  I then placed the other end of the shunt into the common carotid artery after unclamping the artery.  The Rumel was tightened down around the shunt.  At this point, I verified blood flow in the shunt with a continuous doppler.  At this point, I started the endarterectomy in the common carotid artery with a Cytogeneticist and carried this dissection down into the common carotid artery circumferentially.  Then I transected the plaque at a segment where it was adherent.  I then carried this dissection up into the external carotid artery.  The plaque was extracted by unclamping the external carotid artery and everting the artery.  The dissection was then carried into the internal carotid artery, extracting the remaining portion of the carotid plaque.  I passed the plaque off the field as a specimen.  I then spent the next 30 minutes removing intimal flaps and loose debris.  Eventually I reached the point where the residual plaque was densely adherent and any further dissection would compromise the integrity of the wall.  After verifying that there was no more loose intimal flaps or debris, I re-interrogated the entirety of this carotid artery.  At this point, I was satisfied that the minimal remaining disease was densely adherent to the wall and wall integrity was intact.  At this point, I then fashioned a bovine pericardial patch for the geometry of this artery and sewed it in place with two running stitch of 6-0 Prolene, one from each end.  Prior to completing this patch angioplasty, I removed the shunt first from the internal carotid artery, from which there was excellent backbleeding,  and clamped it.  Then I removed the shunt from the common carotid artery, from which there was excellent antegrade bleeding, and then clamped it.  At this point, I allowed the external carotid artery to backbleed, which was excellent.  Then I instilled heparinized saline in this patched artery and then completed the patch angioplasty in the usual fashion.  First, I released the clamp on the external carotid artery, then I released it on the common carotid artery.  After waiting a few seconds, I then released it on the internal carotid artery.  I then interrogated this patient's arteries with the continuous Doppler.  The audible waveforms in each artery were consistent with the expected characteristics for each artery.  The Sonosite probe was then sterilely draped and used to interrogate the carotid artery in both longitudinal and transverse views.  At this point, I washed out the wound, and placed thrombin and Gelfoam throughout.  I also gave the patient 30 mg of protamine to reverse his anticoagulation.   After waiting a few minutes, I removed the thrombin and Gelfoam and washed out the wound.  There was no more active bleeding in the surgical site.   I then reapproximated the platysma muscle with a running stitch of 3-0 Vicryl.  The skin was then reapproximated with a running subcuticular 4-0 Monocryl stitch.  The skin was then cleaned, dried and  Dermabond was used to reinforce the skin closure.  The patient woke without any problems, neurologically intact.     COMPLICATIONS: none  CONDITION: stable  Leonides Sake, MD [facility] Office: [phone] Pager: [phone]  04/17/2011, 10:48 AM

## 2011-04-17 NOTE — H&P (View-Only) (Signed)
[facility] [place]  Referred by:  Eric Davidson, MD [address] [place] [address]  Reason for referral: R leg pain.  History of Present Illness  Thomas Roth is a 55 y.o. male who presents with chief complaint: R leg pain.  Onset of symptom occurred few months ago.  Pain is described as cramping, severity 3-6/10, and associated with ambulation.  Patient has attempted to treat this pain with rest.  The patient has no rest pain symptoms also and no leg wounds/ulcers.  He has previously has had an iliac artery stented on the left.  Additionally, he has previously had a R CEA and CABG completed.  Atherosclerotic risk factors include: Hypertension, hyperlipidemia, smoking.    He notes no stroke sx or hx.  He denies visual sx, hemiplegia, facial drooping, expressive or receptive aphasia.  He has known he has a L ICA >80% from his vascular surgeon in [place], but elected not to have him do his CEA.  He also notes he has a known history of AAA.  He currently has no abdominal or back pain.  Past Medical History  Diagnosis Date  . Hypertension   . Anemia   . AAA (abdominal aortic aneurysm)     Past Surgical History  Procedure Date  . Femoral artery stent     left    History   Social History  . Marital Status: Single    Spouse Name: N/A    Number of Children: N/A  . Years of Education: N/A   Occupational History  . Not on file.   Social History Main Topics  . Smoking status: Former Smoker    Types: Cigarettes    Quit date: 09/02/2010  . Smokeless tobacco: Not on file  . Alcohol Use:   . Drug Use:   . Sexually Active:    Other Topics Concern  . Not on file   Social History Narrative  . No narrative on file    No family history on file.  Current Outpatient Prescriptions on File Prior to Visit  Medication Sig Dispense Refill  . ALPRAZolam (XANAX) 0.5 MG tablet Take 0.5 mg by mouth 2 (two) times daily.        . amLODipine  (NORVASC) 10 MG tablet Take 10 mg by mouth daily.        . aspirin 325 MG EC tablet Take 325 mg by mouth daily.        . benazepril (LOTENSIN) 20 MG tablet Take 20 mg by mouth daily.        . methadone (DOLOPHINE) 10 MG tablet Take 10 mg by mouth every 4 (four) hours as needed.        . metoprolol succinate (TOPROL-XL) 25 MG 24 hr tablet Take 25 mg by mouth daily.        . zolpidem (AMBIEN) 10 MG tablet Take 10 mg by mouth at bedtime as needed.        . cyclobenzaprine (FLEXERIL) 5 MG tablet Take 5 mg by mouth as needed.        . nitroGLYCERIN (NITROSTAT) 0.4 MG SL tablet Place 0.4 mg under the tongue as needed.        . pravastatin (PRAVACHOL) 10 MG tablet Take 10 mg by mouth daily.        . triamterene-hydrochlorothiazide (DYAZIDE) 37.5-25 MG per capsule Take 1 capsule by mouth every Monday, Wednesday, and Friday.          No   Known Allergies   Review of Systems (Positive items checked otherwise negative)  General: [ ] Weight loss, [ ] Weight gain, [ ]  Loss of appetite, [ ] Fever  Neurologic: [x] Dizziness, [ ] Blackouts, [ ] Headaches, [ ] Seizure  Ear/Nose/Throat: [ ] Change in eyesight, [ ] Change in hearing, [ ] Nose bleeds, [ ] Sore throat  Vascular: [x] Pain in legs with walking, [x] Pain in feet while lying flat, [ ] Non-healing ulcer, [x] Stroke, [ ] "Mini stroke", [ ] Slurred speech, [ ] Temporary blindness, [ ] Blood clot in vein, [ ] Phlebitis  Pulmonary: [ ] Home oxygen, [ ] Productive cough, [ ] Bronchitis, [ ] Coughing up blood, [ ] Asthma, [ ] Wheezing  Musculoskeletal: [ ] Arthritis, [ ] Joint pain, [ ] Muscle pain  Cardiac: [ ] Chest pain, [ ] Chest tightness/pressure, [ ] Shortness of breath when lying flat, [x] Shortness of breath with exertion, [ ] Palpitations, [ ] Heart murmur, [ ] Arrythmia, [ ] Atrial fibrillation  Hematologic: [ ] Bleeding problems, [ ] Clotting disorder, [ ] Anemia  Psychiatric:  [ ] Depression, [ ] Anxiety, [ ] Attention deficit  disorder  Gastrointestinal:  [ ] Black stool,[ ]  Blood in stool, [ ] Peptic ulcer disease, [ ] Reflux, [ ] Hiatal hernia, [ ] Trouble swallowing, [ ] Diarrhea, [ ] Constipation  Urinary:  [ ] Kidney disease, [ ] Burning with urination, [ ] Frequent urination, [ ] Difficulty urinating  Skin: [ ] Ulcers, [ ] Rashes   Physical Examination  Filed Vitals:   03/24/11 1145 03/24/11 1159  BP: 133/74 113/69  Pulse: 62 58  Resp: 16   Height: 5' 11" (1.803 m)   Weight: 225 lb (102.059 kg)   SpO2: 98% 97%   Body mass index is 31.38 kg/(m^2).   General: A&O x 3, WDWN, obese  Head: [place]/AT  Ear/Nose/Throat: Hearing grossly intact, nares w/o erythema or drainage, oropharynx w/o Erythema/Exudate  Eyes: PERRLA, EOMI  Neck: Supple, no nuchal rigidity, no palpable LAD  Pulmonary: Sym exp, good air movt, CTAB, no rales, rhonchi, & wheezing  Cardiac: RRR, Nl S1, S2, no Murmurs, rubs or gallops  Vascular: Vessel Right Left  Radial Palpable Palpable  Brachial Palpable Palpable  Carotid Palpable, with bruit Palpable, without bruit  Aorta Non-palpable N/A  Femoral Non-Palpable Non-Palpable  Popliteal Non-palpable Non-palpable  PT Non-Palpable Non-Palpable  DP Non-Palpable Non-Palpable   Gastrointestinal: soft, NTND, -G/R, - HSM, - masses, - CVAT B, obese  Musculoskeletal: M/S 5/5 throughout , Extremities without ischemic changes except some dependent rubor in both feet, some mild CVI changes in both legs  Neurologic: CN 2-12 intact , Pain and light touch intact in extremities , Motor exam as listed above  Psychiatric: Judgment intact, Mood & affect appropriate for pt's clinical situation  Dermatologic: See M/S exam for extremity exam, no rashes otherwise noted  Lymph : No Cervical, Axillary, or Inguinal lymphadenopathy   Non-Invasive Vascular Imaging  CAROTID DUPLEX (Date: 03/24/11):   R ICA stenosis: patent  R VA: abnormal  R proximal SCA  PSV 319 c/s  L ICA stenosis:  80-99%  L VA:  patent and antegrade  ABI (Date: 03/24/11)  RLE: 0.70, PT: monophasic, AT: biphasic  LLE: 1.02, PT: monophasic, AT: biphasic  R leg arterial duplex (Date: 03/24/11)  Monophasic waveforms throughout R femoropopliteal system, PSV 379 c/s in prox. PFA and 405 c/s prox.  SFA    Outside Studies/Documentation 10 pages of outside documents were reviewed including: BLE arterial studies, AAA ultrasound.  Medical Decision Making  Thomas Roth is a 55 y.o. male who presents with: Asx L ICA stenosis, BLE intermittent claudication, and small AAA   Based on ACAS, he will need a L CEA.  Given his significant cardiac risks, he need preoperative cardiology evaluation prior to proceeding.  He will follow up in 2 weeks after his cardiac workup is completed.   I discussed with the patient the risks, benefits, and alternatives to carotid endarterectomy.  I discussed the procedural details of carotid endarterectomy with the patient.  The patient is aware that the risks of carotid endarterectomy include but are not limited to: bleeding, infection, stroke, myocardial infarction, death, cranial nerve injuries both temporary and permanent, neck hematoma, possible airway compromise, labile blood pressure post-operatively, cerebral hyperperfusion syndrome, and possible need for additional interventions in the future. The patient is aware of the risks and agrees to proceed forward with the procedure. After this is addressed then we will do an Aortogram, Bilateral leg runoff, possible intervention on R leg.  The aortogram will interrogate the AAA also.  I discussed with the patient the natural history of intermittent claudication: 75% of patients have stable or improved symptoms in a year an only 2% require amputation. Eventually 20% may require intervention in a year.  I discussed in depth with the patient the nature of atherosclerosis, and emphasized the importance of maximal medical management  including strict control of blood pressure, blood glucose, and lipid levels, obtaining regular exercise, and cessation of smoking.  The patient is aware that without maximal medical management the underlying atherosclerotic disease process will progress, limiting the benefit of any interventions.  I discussed in depth with the patient a walking plan and how to execute such.  The patient is not interested in starting Pletal.  Thank you for allowing us to participate in this patient's care.  Taraoluwa Thakur, MD [facility] Office: [phone] Pager: [phone]  03/24/2011, 5:43 PM    

## 2011-04-17 NOTE — Progress Notes (Signed)
Admit Complaint: 56 y.o. male who presents with left asymptomatic carotid stenosis 80-99%  Pharmacist System-Based Medication Review: Anticoagulation - LMWH for prophylaxis  Infectious Disease - RX to adjust for renal fxn.  Currently getting Zinacef x 2 more doses - Dose Appropriate.  Will monitor renal fxn. and adjust as needed.  Cardiovascular - Known HTN/AAA - meds PTA include (Amlodipine, ASA, Benazepril, Metoprolol - these have been resumed).  Endocrinology - No hx. Of DM but serum glucose = 169 this AM - no HgbA1C  - may need SSI coverage while in hospital  Gastrointestinal / Nutrition - Obesity, but no c/o GERD - LFT's are WNL  Neurology - No hx., responds appropriately  Nephrology - Creat. 1.24, est. Clearance > 79ml/min.  K+ on the upper side of normal but WNL  Pulmonary - No hx. Of Pulm. disease - currently on RA with sat's at 96%  Hematology / Oncology - No CBC, but no hx. of issues.  PTA Medication Issues - None  Best Practices - Lovenox   Plan:  Will follow renal function and adjust antibiotics as needed.  Nadara Mustard, PharmD. MS Clinical Pharmacist Pager:  [phone] 04/17/2011 15:56PM

## 2011-04-17 NOTE — Discharge Summary (Signed)
[facility] Discharge Summary   Patient ID:  Thomas Roth MRN: [id number] DOB/AGE: 1955/06/26 56 y.o.  Admit date: 04/17/2011 Discharge date: 04/17/2011 Date of Surgery: 04/17/2011 Surgeon: Surgeon(s): Nilda Simmer, MD  Admission Diagnosis: carotid stenosis  Discharge Diagnoses:  carotid stenosis  Secondary Diagnoses: Past Medical History  Diagnosis Date  . Hypertension   . Anemia   . AAA (abdominal aortic aneurysm)   . Heart murmur   . H/O hiatal hernia   . Chronic kidney disease     1 kidney functioning- 1 not functioning due to vascular  . Stroke 2000  . Pneumonia     Emphesma  . Anxiety   . CHF (congestive heart failure)   . MRSA (methicillin resistant Staphylococcus aureus) infection 2011    R foot  . Neuromuscular disorder     Neuropathy  . Sciatic pain   . Lower back pain   . Peripheral vascular disease   . Coronary artery disease 2007    CABG  3 vessels    Procedures: Procedure(s): ENDARTERECTOMY CAROTID  Discharged Condition: good  HPI:  [name] is a 56 y.o. male who initially presented to our office with chief complaint: R leg pain. Onset of symptom occurred few months ago. Pain is described as cramping, severity 3-6/10, and associated with ambulation.He has previously has had an iliac artery stented on the left. Additionally, he has previously had a R CEA and CABG completed. Atherosclerotic risk factors include: Hypertension, hyperlipidemia, smoking.  During our work -up the following duplex scan of his carotid arteries showed *0 -99 % left carotid stenosis. He was  Admitted for elective L CEA after cardiac clearance.  CAROTID DUPLEX (Date: 03/24/11):  R ICA stenosis: patent  R VA: abnormal  R proximal SCA PSV 319 c/s  L ICA stenosis: 80-99%  L VA: patent and antegrade   Hospital Course:  [name] is a 56 y.o. male is S/P Left Procedure(s): ENDARTERECTOMY CAROTID Extubated: POD # 0 Post-op wounds healing  well Pt. Ambulating, voiding and taking PO diet without difficulty. Pt pain controlled with PO pain meds. Labs as below Complications:none  Consults:     Significant Diagnostic Studies: CBC    Component Value Date/Time   WBC 12.3* 04/17/2011 1620   RBC 3.96* 04/17/2011 1620   HGB 11.4* 04/17/2011 1620   HCT 34.5* 04/17/2011 1620   PLT 208 04/17/2011 1620   MCV 87.1 04/17/2011 1620   MCH 28.8 04/17/2011 1620   MCHC 33.0 04/17/2011 1620   RDW 13.8 04/17/2011 1620    BMET    Component Value Date/Time   NA 135 04/17/2011 0641   K 5.1 04/17/2011 0641   CL 100 04/17/2011 0641   CO2 21 04/17/2011 0641   GLUCOSE 169* 04/17/2011 0641   BUN 19 04/17/2011 0641   CREATININE 1.06 04/17/2011 1620   CALCIUM 9.5 04/17/2011 0641   GFRNONAA 77* 04/17/2011 1620   GFRAA 89* 04/17/2011 1620    COAG No results found for this basename: INR, PROTIME     Disposition:  Discharge to :Home Discharge Orders    Future Appointments: Provider: Department: Dept Phone: Center:   05/05/2011 8:45 AM Nilda Simmer, MD [facility] [phone] [facility]     Future Orders Please Complete By Expires   Resume previous diet      Driving Restrictions      Comments:   No driving for 2 weeks   Lifting restrictions      Comments:  No lifting for 4 weeks   Call MD for:  temperature >100.5      Call MD for:  redness, tenderness, or signs of infection (pain, swelling, bleeding, redness, odor or green/yellow discharge around incision site)      Call MD for:  severe or increased pain, loss or decreased feeling  in affected limb(s)      Increase activity slowly      Comments:   Walk with assistance use walker or cane as needed   May shower       Scheduling Instructions:   wednesday   No dressing needed      may wash over wound with mild soap and water      CAROTID Sugery: Call MD for difficulty swallowing or speaking; weakness in arms or legs that is a new symtom; severe headache.  If you have increased swelling in  the neck and/or  are having difficulty breathing, CALL 911         Kermit, Arnette  Home Medication Instructions ONG:[id number]   Printed on:04/17/11 2048  Medication Information                    aspirin 325 MG EC tablet Take 325 mg by mouth daily.             ALPRAZolam (XANAX) 0.5 MG tablet Take 0.5 mg by mouth 2 (two) times daily.            benazepril (LOTENSIN) 20 MG tablet Take 20 mg by mouth daily.             amLODipine (NORVASC) 10 MG tablet Take 10 mg by mouth daily.             metoprolol succinate (TOPROL-XL) 25 MG 24 hr tablet Take 25 mg by mouth 2 (two) times daily.            methadone (DOLOPHINE) 10 MG tablet Take 10 mg by mouth. Two tablets 5 times daily ( 100 mg total daily)           nitroGLYCERIN (NITROSTAT) 0.4 MG SL tablet Place 0.4 mg under the tongue every 5 (five) minutes as needed. For chest pain           zolpidem (AMBIEN) 10 MG tablet Take 10 mg by mouth at bedtime as needed. For sleep            Verbal and written Discharge instructions given to the patient. Wound care per Discharge AVS Follow-up Information    Follow up with Nilda Simmer, MD in 2 weeks. (office will arrange - sent)    Contact information:   [address] [place] [address] [phone]          Signed: Marlowe Shores 04/17/2011, 8:48 PM

## 2011-04-17 NOTE — Interval H&P Note (Signed)
--    [facility] [place]  History and Physical Update  The patient was interviewed and re-examined.  The patient's History and Physical has been reviewed and is unchanged.  He has recently undergone cardiac cath and was cleared for L CEA.  There is no change in the plan of care.  Leonides Sake, MD [facility] [place] Office: [phone] Pager: [phone]  04/17/2011, 7:28 AM

## 2011-04-17 NOTE — Transfer of Care (Signed)
Immediate Anesthesia Transfer of Care Note  Patient: Thomas Roth  Procedure(s) Performed:  ENDARTERECTOMY CAROTID - with patch angioplasty  Patient Location: PACU  Anesthesia Type: General  Level of Consciousness: awake, alert  and oriented  Airway & Oxygen Therapy: Patient Spontanous Breathing and Patient connected to nasal cannula oxygen  Post-op Assessment: Report given to PACU RN, Post -op Vital signs reviewed and stable, Patient moving all extremities, Patient moving all extremities X 4 and Patient able to stick tongue midline  Post vital signs: Reviewed and stable Filed Vitals:   04/17/11 1110  BP:   Pulse:   Temp: 37.3 C  Resp:     Complications: No apparent anesthesia complications

## 2011-04-17 NOTE — OR Nursing (Signed)
1103 post op neuro checks, handgrips strong and equal, moves all four extremities, tongue midline.

## 2011-04-17 NOTE — Anesthesia Procedure Notes (Addendum)
Performed by: Bronson Ing F   Procedure Name: Intubation Date/Time: 04/17/2011 7:46 AM Performed by: Carmela Rima Pre-anesthesia Checklist: Patient identified, Timeout performed, Emergency Drugs available, Suction available and Patient being monitored Patient Re-evaluated:Patient Re-evaluated prior to inductionOxygen Delivery Method: Circle System Utilized Preoxygenation: Pre-oxygenation with 100% oxygen Intubation Type: IV induction Ventilation: Mask ventilation without difficulty Laryngoscope Size: Mac and 3 Grade View: Grade I Tube type: Oral Tube size: 7.5 mm Number of attempts: 1 Placement Confirmation: ETT inserted through vocal cords under direct vision,  breath sounds checked- equal and bilateral,  positive ETCO2 and CO2 detector Secured at: 23 cm Tube secured with: Tape Dental Injury: Teeth and Oropharynx as per pre-operative assessment

## 2011-04-17 NOTE — Anesthesia Postprocedure Evaluation (Signed)
Anesthesia Post Note  Patient: Thomas Roth  Procedure(s) Performed:  ENDARTERECTOMY CAROTID - with patch angioplasty  Anesthesia type: General  Patient location: PACU  Post pain: Pain level controlled and Adequate analgesia  Post assessment: Post-op Vital signs reviewed, Patient's Cardiovascular Status Stable, Respiratory Function Stable, Patent Airway and Pain level controlled  Last Vitals:  Filed Vitals:   04/17/11 1110  BP:   Pulse:   Temp: 37.3 C  Resp:     Post vital signs: Reviewed and stable  Level of consciousness: awake, alert  and oriented  Complications: No apparent anesthesia complications

## 2011-04-17 NOTE — Progress Notes (Signed)
Pt arrived to PACU with aline bp reading 80's over 50's, Regina PA notified, re-checked cuff pressures on all extremities with readings of 60's syst over 30's, orders rec'd to start boluses, will re-assess BP's.  Dr.Chen at bedside, agrees with orders.

## 2011-04-17 NOTE — Anesthesia Preprocedure Evaluation (Addendum)
Anesthesia Evaluation  Patient identified by MRN, date of birth, ID band Patient awake    Reviewed: Allergy & Precautions, H&P , NPO status , Patient's Chart, lab work & pertinent test results  Airway Mallampati: II  Neck ROM: full    Dental   Pulmonary pneumonia ,          Cardiovascular hypertension, + CAD and +CHF + Valvular Problems/Murmurs regular     Neuro/Psych Anxiety  Neuromuscular disease CVA    GI/Hepatic hiatal hernia,   Endo/Other    Renal/GU      Musculoskeletal   Abdominal   Peds  Hematology   Anesthesia Other Findings   Reproductive/Obstetrics                          Anesthesia Physical Anesthesia Plan  ASA: IV  Anesthesia Plan: General   Post-op Pain Management:    Induction: Intravenous  Airway Management Planned: Oral ETT  Additional Equipment:   Intra-op Plan:   Post-operative Plan: Extubation in OR  Informed Consent: I have reviewed the patients History and Physical, chart, labs and discussed the procedure including the risks, benefits and alternatives for the proposed anesthesia with the patient or authorized representative who has indicated his/her understanding and acceptance.   Dental Advisory Given  Plan Discussed with: CRNA and Surgeon  Anesthesia Plan Comments:        Anesthesia Quick Evaluation

## 2011-04-17 NOTE — Progress Notes (Signed)
Boluses X2 given, no changes in blood pressure noted, Dopamine IV drip initiated at 1217, pt tol/well, art line bp slight increase to 104/43, will cont to assess and adjust as needed

## 2011-04-17 NOTE — OR Nursing (Signed)
725 Preop Neuro check- Handgrips strong and equal bilateral, moves all four extremities, tongue midline.

## 2011-04-18 ENCOUNTER — Encounter (HOSPITAL_COMMUNITY): Payer: Self-pay | Admitting: Vascular Surgery

## 2011-04-18 LAB — BASIC METABOLIC PANEL
BUN: 13 mg/dL (ref 6–23)
Calcium: 9.1 mg/dL (ref 8.4–10.5)
Creatinine, Ser: 1.06 mg/dL (ref 0.50–1.35)
GFR calc Af Amer: 89 mL/min — ABNORMAL LOW (ref >90)

## 2011-04-18 LAB — CBC
MCH: 28.4 pg (ref 26.0–34.0)
MCHC: 32.4 g/dL (ref 30.0–36.0)
MCV: 87.8 fL (ref 78.0–100.0)
Platelets: 202 10*3/uL (ref 150–400)
RDW: 13.6 % (ref 11.5–15.5)

## 2011-04-18 MED ORDER — OXYCODONE HCL 5 MG PO TABS: 5.0000 mg | ORAL_TABLET | Freq: Four times a day (QID) | ORAL | Status: AC | PRN

## 2011-04-18 MED FILL — Mupirocin Oint 2%: CUTANEOUS | Qty: 22 | Status: AC

## 2011-04-18 NOTE — Plan of Care (Signed)
Problem: Phase II Discharge Progression Outcomes Goal: Ambulates without assistance Outcome: Adequate for Discharge Pt uses a cane at home, using min. Assist with walker here.

## 2011-04-18 NOTE — Discharge Summary (Signed)
Addendum  I have independently interviewed and examined the patient, and I agree with the physician assistant's findings.    Leonides Sake, MD [facility] [place] Office: [phone] Pager: [phone]  04/18/2011, 8:46 AM

## 2011-04-18 NOTE — Plan of Care (Signed)
Problem: Consults Goal: CEA/CES/AAA Patient Education (See Patient Education module for education specifics.) Outcome: Progressing Pt told he cant drive for 2 weeks, pt told when to call nurse while here and doc when an outpatient Goal: Diagnosis CEA/CES/AAA Stent Carotid Endarterectomy (CEA)

## 2011-04-18 NOTE — Discharge Summary (Signed)
[facility] Discharge Summary  AERO DRUMMONDS Aug 03, 1955 56 y.o. male  [id number]  Admission Date: 04/17/2011  Discharge Date: 04/18/11  Physician: Nilda Simmer, MD  Admission Diagnosis: carotid stenosis   HPI:   This is a 56 y.o. male who presents with chief complaint: R leg pain. Onset of symptom occurred few months ago. Pain is described as cramping, severity 3-6/10, and associated with ambulation. Patient has attempted to treat this pain with rest. The patient has no rest pain symptoms also and no leg wounds/ulcers. He has previously has had an iliac artery stented on the left. Additionally, he has previously had a R CEA and CABG completed. Atherosclerotic risk factors include: Hypertension, hyperlipidemia, smoking    Hospital Course:  The patient was admitted to the hospital and taken to the operating room on 04/17/2011 and underwent left CEA.  The pt tolerated the procedure well and was transported to the PACU in good condition. By POD 1, his neuro exam was in tact and he was doing well.  His drain had minimal drainage and was removed. The remainder of the hospital course consisted of increasing ambulation and increasing intake of solids without difficulty.    Basename 04/18/11 0400 04/17/11 0641  NA 137 135  K 4.1 5.1  CL 103 100  CO2 25 21  GLUCOSE 132* 169*  BUN 13 19  CALCIUM 9.1 9.5    Basename 04/18/11 0400 04/17/11 1620  WBC 9.2 12.3*  HGB 11.2* 11.4*  HCT 34.6* 34.5*  PLT 202 208     Discharge Instructions:   The patient is discharged to home with extensive instructions on wound care and progressive ambulation.  They are instructed not to drive or perform any heavy lifting until returning to see the physician in his office.  Discharge Orders    Future Appointments: Provider: Department: Dept Phone: Center:   05/05/2011 8:45 AM Nilda Simmer, MD [facility] [phone] [facility]     Future Orders Please Complete By Expires   Resume previous diet      Driving Restrictions      Comments:   No driving for 2 weeks   Lifting restrictions      Comments:   No lifting for 4 weeks   Call MD for:  temperature >100.5      Call MD for:  redness, tenderness, or signs of infection (pain, swelling, bleeding, redness, odor or green/yellow discharge around incision site)      Call MD for:  severe or increased pain, loss or decreased feeling  in affected limb(s)      Increase activity slowly      Comments:   Walk with assistance use walker or cane as needed   May shower       Scheduling Instructions:   wednesday   No dressing needed      may wash over wound with mild soap and water      CAROTID Sugery: Call MD for difficulty swallowing or speaking; weakness in arms or legs that is a new symtom; severe headache.  If you have increased swelling in the neck and/or  are having difficulty breathing, CALL 911         Discharge Diagnosis:  carotid stenosis  Secondary Diagnosis: Patient Active Problem List  Diagnoses  . PVD (peripheral vascular disease)  . Intermittent claudication  . AAA (abdominal aortic aneurysm) without rupture  . Carotid stenosis  . Occlusion and stenosis of carotid artery without mention of cerebral infarction  .  Atherosclerosis of native arteries of the extremities with intermittent claudication   Past Medical History  Diagnosis Date  . Hypertension   . Anemia   . AAA (abdominal aortic aneurysm)   . Heart murmur   . H/O hiatal hernia   . Chronic kidney disease     1 kidney functioning- 1 not functioning due to vascular  . Stroke 2000  . Pneumonia     Emphesma  . Anxiety   . CHF (congestive heart failure)   . MRSA (methicillin resistant Staphylococcus aureus) infection 2011    R foot  . Neuromuscular disorder     Neuropathy  . Sciatic pain   . Lower back pain   . Peripheral vascular disease   . Coronary artery disease 2007    CABG  3 vessels       Juanmiguel, Defelice  Home  Medication Instructions [id number]   Printed on:04/18/11 0730  Medication Information                    aspirin 325 MG EC tablet Take 325 mg by mouth daily.             ALPRAZolam (XANAX) 0.5 MG tablet Take 0.5 mg by mouth 2 (two) times daily.            benazepril (LOTENSIN) 20 MG tablet Take 20 mg by mouth daily.             amLODipine (NORVASC) 10 MG tablet Take 10 mg by mouth daily.             metoprolol succinate (TOPROL-XL) 25 MG 24 hr tablet Take 25 mg by mouth 2 (two) times daily.            methadone (DOLOPHINE) 10 MG tablet Take 10 mg by mouth. Two tablets 5 times daily ( 100 mg total daily)           nitroGLYCERIN (NITROSTAT) 0.4 MG SL tablet Place 0.4 mg under the tongue every 5 (five) minutes as needed. For chest pain           zolpidem (AMBIEN) 10 MG tablet Take 10 mg by mouth at bedtime as needed. For sleep           oxyCODONE (OXY IR/ROXICODONE) 5 MG immediate release tablet Take 1 tablet (5 mg total) by mouth every 6 (six) hours as needed.             Disposition: home  Patient's condition: is Good  Follow up: 1. Dr. Imogene Burn in 2 weeks.   Newton Pigg, PA-C [facility] [phone] 04/18/2011  7:30 AM  Addendum  I have independently interviewed and examined the patient, and I agree with the physician assistant's findings.  The patient had a unremarkable post-operative course, with full neurologic function intact and hemodynamic stability overnight despite initial hypotension in PACU.  His TLS drain has had minimal serosang. drainage.    Leonides Sake, MD [facility] [place] Office: [phone] Pager: [phone]  04/18/2011, 7:45 AM

## 2011-04-18 NOTE — Progress Notes (Signed)
Pt being discharged home, all d'c instructions given, both patient and wife verbalized understanding.

## 2011-04-18 NOTE — Progress Notes (Addendum)
[facility] Progress Note  04/18/2011 7:19 AM POD 1  Subjective:  No complaints  Tm 99.2 now 98.7  96% 3LO2NC Filed Vitals:   04/18/11 0555  BP: 127/50  Pulse: 67  Temp:   Resp: 19     Physical Exam: Neuro:  In tact; No deficits Incision:  C/d/i without drainage; minimal drainage from drain.  CBC    Component Value Date/Time   WBC 9.2 04/18/2011 0400   RBC 3.94* 04/18/2011 0400   HGB 11.2* 04/18/2011 0400   HCT 34.6* 04/18/2011 0400   PLT 202 04/18/2011 0400   MCV 87.8 04/18/2011 0400   MCH 28.4 04/18/2011 0400   MCHC 32.4 04/18/2011 0400   RDW 13.6 04/18/2011 0400    BMET    Component Value Date/Time   NA 137 04/18/2011 0400   K 4.1 04/18/2011 0400   CL 103 04/18/2011 0400   CO2 25 04/18/2011 0400   GLUCOSE 132* 04/18/2011 0400   BUN 13 04/18/2011 0400   CREATININE 1.06 04/18/2011 0400   CALCIUM 9.1 04/18/2011 0400   GFRNONAA 77* 04/18/2011 0400   GFRAA 89* 04/18/2011 0400       Assessment/Plan:  This is a 56 y.o. male who is s/p left CEA POD 1 -doing well this am. -acute surgical blood loss anemia--pt tolerating -drain with minimal drainage--d/c drain -off dopamine this am--BP tolerating. -pt has ambulated in halls -foley d/c'd -- pt needs to void before d/c home.  Newton Pigg, PA-C [facility] [phone]  Addendum  I have independently interviewed and examined the patient, and I agree with the physician assistant's findings.  Neuro intact with stable BP off dopamine.  Minimal serosang drainage from TLS.  - D/C drain - D/C pt once he voids  Leonides Sake, MD [facility] [place] Office: [phone] Pager: [phone]  04/18/2011, 7:48 AM

## 2011-05-04 ENCOUNTER — Encounter: Payer: Self-pay | Admitting: Vascular Surgery

## 2011-05-05 ENCOUNTER — Encounter: Payer: Self-pay | Admitting: Vascular Surgery

## 2011-05-05 ENCOUNTER — Ambulatory Visit (INDEPENDENT_AMBULATORY_CARE_PROVIDER_SITE_OTHER): Payer: Medicare Other | Admitting: Vascular Surgery

## 2011-05-05 VITALS — BP 116/70 | HR 66 | Temp 98.0°F | Ht 71.0 in | Wt 224.0 lb

## 2011-05-05 DIAGNOSIS — I6529 Occlusion and stenosis of unspecified carotid artery: Secondary | ICD-10-CM

## 2011-05-05 DIAGNOSIS — Z9889 Other specified postprocedural states: Secondary | ICD-10-CM

## 2011-05-05 NOTE — Progress Notes (Signed)
[facility] [place]  Postoperative Visit  History of Present Illness  Thomas Roth is a 56 y.o. male who presents for postoperative follow-up for: L CEA (Date: 04/17/11).  The patient's neck incision is healed.  The patient has had no stroke or TIA symptoms.  Physical Examination  Filed Vitals:   05/05/11 0856  BP: 116/70  Pulse: 66  Temp: 98 F (36.7 C)    L Neck: Incision is healing, Dermabond is peeling off Neuro: CN 2-12 are intact , Motor strength is 5/5 bilaterally, sensation is grossly intact  Medical Decision Making  Thomas Roth is a 56 y.o. male who presents s/p L CEA.  The patient's neck incision is healing with no stroke symptoms. I discussed in depth with the patient the nature of atherosclerosis, and emphasized the importance of maximal medical management including strict control of blood pressure, blood glucose, and lipid levels, obtaining regular exercise, and cessation of smoking.  The patient is aware that without maximal medical management the underlying atherosclerotic disease process will progress, limiting the benefit of any interventions. The patient's surveillance will included routine carotid duplex studies which will be completed in: 3 months, at which time the patient will be re-evaluated.   I emphasized the importance of routine surveillance of the carotid arteries as recurrence of stenosis is possible, especially with proper management of underlying atherosclerotic disease. The patient agrees to participate in their maximal medical care and routine surveillance. The pt has R leg PAD that will need to be addressed but his wife has health issues that will need be addressed before he is willing to proceed with any intervention on that leg.  Subsequently, we will manage that in 3 months at his follow up at that time.  Thank you for allowing [place] to participate in this patient's care.  Leonides Sake, MD [facility]  [place] Office: [phone] Pager: [phone]

## 2011-08-10 ENCOUNTER — Encounter: Payer: Self-pay | Admitting: Neurosurgery

## 2011-08-11 ENCOUNTER — Other Ambulatory Visit: Payer: Medicare Other

## 2011-08-11 ENCOUNTER — Ambulatory Visit: Payer: Medicare Other | Admitting: Neurosurgery

## 2013-02-11 ENCOUNTER — Encounter: Payer: Self-pay | Admitting: Family

## 2013-02-11 ENCOUNTER — Other Ambulatory Visit: Payer: Self-pay | Admitting: *Deleted

## 2013-02-11 DIAGNOSIS — Z48812 Encounter for surgical aftercare following surgery on the circulatory system: Secondary | ICD-10-CM

## 2013-02-12 ENCOUNTER — Ambulatory Visit: Payer: Medicare Other | Admitting: Family

## 2013-02-12 ENCOUNTER — Inpatient Hospital Stay (HOSPITAL_COMMUNITY): Admission: RE | Admit: 2013-02-12 | Payer: Medicare Other | Source: Ambulatory Visit

## 2013-07-06 ENCOUNTER — Encounter (HOSPITAL_COMMUNITY): Payer: Self-pay | Admitting: Emergency Medicine

## 2013-07-06 ENCOUNTER — Emergency Department (HOSPITAL_COMMUNITY)
Admission: EM | Admit: 2013-07-06 | Discharge: 2013-07-06 | Disposition: A | Discharge status: Home / Self Care | Payer: Medicare Other | Attending: Emergency Medicine | Admitting: Emergency Medicine

## 2013-07-06 ENCOUNTER — Emergency Department (HOSPITAL_COMMUNITY): Payer: Medicare Other

## 2013-07-06 DIAGNOSIS — I129 Hypertensive chronic kidney disease with stage 1 through stage 4 chronic kidney disease, or unspecified chronic kidney disease: Secondary | ICD-10-CM | POA: Insufficient documentation

## 2013-07-06 DIAGNOSIS — Z7902 Long term (current) use of antithrombotics/antiplatelets: Secondary | ICD-10-CM | POA: Insufficient documentation

## 2013-07-06 DIAGNOSIS — Z951 Presence of aortocoronary bypass graft: Secondary | ICD-10-CM | POA: Insufficient documentation

## 2013-07-06 DIAGNOSIS — Z79899 Other long term (current) drug therapy: Secondary | ICD-10-CM | POA: Insufficient documentation

## 2013-07-06 DIAGNOSIS — R011 Cardiac murmur, unspecified: Secondary | ICD-10-CM | POA: Insufficient documentation

## 2013-07-06 DIAGNOSIS — L03115 Cellulitis of right lower limb: Secondary | ICD-10-CM

## 2013-07-06 DIAGNOSIS — F17200 Nicotine dependence, unspecified, uncomplicated: Secondary | ICD-10-CM | POA: Insufficient documentation

## 2013-07-06 DIAGNOSIS — Z862 Personal history of diseases of the blood and blood-forming organs and certain disorders involving the immune mechanism: Secondary | ICD-10-CM | POA: Insufficient documentation

## 2013-07-06 DIAGNOSIS — L03119 Cellulitis of unspecified part of limb: Principal | ICD-10-CM

## 2013-07-06 DIAGNOSIS — F411 Generalized anxiety disorder: Secondary | ICD-10-CM | POA: Insufficient documentation

## 2013-07-06 DIAGNOSIS — N189 Chronic kidney disease, unspecified: Secondary | ICD-10-CM | POA: Insufficient documentation

## 2013-07-06 DIAGNOSIS — Z7982 Long term (current) use of aspirin: Secondary | ICD-10-CM | POA: Insufficient documentation

## 2013-07-06 DIAGNOSIS — Z9889 Other specified postprocedural states: Secondary | ICD-10-CM | POA: Insufficient documentation

## 2013-07-06 DIAGNOSIS — L02619 Cutaneous abscess of unspecified foot: Secondary | ICD-10-CM | POA: Insufficient documentation

## 2013-07-06 DIAGNOSIS — Z8614 Personal history of Methicillin resistant Staphylococcus aureus infection: Secondary | ICD-10-CM | POA: Insufficient documentation

## 2013-07-06 DIAGNOSIS — I2510 Atherosclerotic heart disease of native coronary artery without angina pectoris: Secondary | ICD-10-CM | POA: Insufficient documentation

## 2013-07-06 DIAGNOSIS — Z8673 Personal history of transient ischemic attack (TIA), and cerebral infarction without residual deficits: Secondary | ICD-10-CM | POA: Insufficient documentation

## 2013-07-06 DIAGNOSIS — Z8701 Personal history of pneumonia (recurrent): Secondary | ICD-10-CM | POA: Insufficient documentation

## 2013-07-06 LAB — CBC WITH DIFFERENTIAL/PLATELET
Basophils Absolute: 0.1 10*3/uL (ref 0.0–0.1)
Basophils Relative: 1 % (ref 0–1)
EOS ABS: 0.8 10*3/uL — AB (ref 0.0–0.7)
EOS PCT: 8 % — AB (ref 0–5)
HCT: 38.3 % — ABNORMAL LOW (ref 39.0–52.0)
Hemoglobin: 12.1 g/dL — ABNORMAL LOW (ref 13.0–17.0)
Lymphocytes Relative: 14 % (ref 12–46)
Lymphs Abs: 1.5 10*3/uL (ref 0.7–4.0)
MCH: 28.4 pg (ref 26.0–34.0)
MCHC: 31.6 g/dL (ref 30.0–36.0)
MCV: 89.9 fL (ref 78.0–100.0)
MONOS PCT: 7 % (ref 3–12)
Monocytes Absolute: 0.7 10*3/uL (ref 0.1–1.0)
NEUTROS ABS: 7.4 10*3/uL (ref 1.7–7.7)
NEUTROS PCT: 71 % (ref 43–77)
Platelets: 286 10*3/uL (ref 150–400)
RBC: 4.26 MIL/uL (ref 4.22–5.81)
RDW: 14.5 % (ref 11.5–15.5)
WBC: 10.4 10*3/uL (ref 4.0–10.5)

## 2013-07-06 LAB — BASIC METABOLIC PANEL
BUN: 21 mg/dL (ref 6–23)
CALCIUM: 9.9 mg/dL (ref 8.4–10.5)
CO2: 29 mEq/L (ref 19–32)
CREATININE: 1.18 mg/dL (ref 0.50–1.35)
Chloride: 100 mEq/L (ref 96–112)
GFR, EST AFRICAN AMERICAN: 77 mL/min — AB (ref >90)
GFR, EST NON AFRICAN AMERICAN: 66 mL/min — AB (ref >90)
Glucose, Bld: 111 mg/dL — ABNORMAL HIGH (ref 70–99)
Potassium: 5 mEq/L (ref 3.7–5.3)
Sodium: 141 mEq/L (ref 137–147)

## 2013-07-06 LAB — CBG MONITORING, ED: Glucose-Capillary: 145 mg/dL — ABNORMAL HIGH (ref 70–99)

## 2013-07-06 MED ORDER — MORPHINE SULFATE 4 MG/ML IJ SOLN
4.0000 mg | Freq: Once | INTRAMUSCULAR | Status: AC
  Administered 2013-07-06: 4 mg via INTRAVENOUS
  Filled 2013-07-06: qty 1

## 2013-07-06 MED ORDER — SULFAMETHOXAZOLE-TRIMETHOPRIM 800-160 MG PO TABS: 1.0000 | ORAL_TABLET | Freq: Two times a day (BID) | ORAL | Status: AC

## 2013-07-06 MED ORDER — SULFAMETHOXAZOLE-TMP DS 800-160 MG PO TABS
1.0000 | ORAL_TABLET | Freq: Once | ORAL | Status: AC
  Administered 2013-07-06: 1 via ORAL
  Filled 2013-07-06: qty 1

## 2013-07-06 MED ORDER — SODIUM CHLORIDE 0.9 % IV SOLN
3.0000 g | Freq: Once | INTRAVENOUS | Status: AC
  Administered 2013-07-06: 3 g via INTRAVENOUS
  Filled 2013-07-06: qty 3

## 2013-07-06 MED ORDER — OXYCODONE-ACETAMINOPHEN 5-325 MG PO TABS: 1.0000 | ORAL_TABLET | ORAL | Status: DC | PRN

## 2013-07-06 MED ORDER — AMOXICILLIN-POT CLAVULANATE 875-125 MG PO TABS: 1.0000 | ORAL_TABLET | Freq: Two times a day (BID) | ORAL | Status: DC

## 2013-07-06 MED ORDER — OXYCODONE-ACETAMINOPHEN 5-325 MG PO TABS
2.0000 | ORAL_TABLET | Freq: Once | ORAL | Status: AC
  Administered 2013-07-06: 2 via ORAL
  Filled 2013-07-06: qty 2

## 2013-07-06 NOTE — ED Notes (Signed)
PT AND HIS WIFE BOTH WERE GIVEN DIET COKES AND GRAHAM CRACKERS.

## 2013-07-06 NOTE — ED Notes (Addendum)
R foot swelling, discoloration and pain 10/10 began 3 days ago.  Pain and discoloration decrease some with elevation of foot.  Had 5 arterial stents placed in R leg 3 weeks ago at [facility] by Drs. Hyacinth MeekerMiller and Chemical engineerweezer.  Patient is diabetic and has one kidney.  Reports CBGs "have been okay".  Denies chills/fever.

## 2013-07-06 NOTE — ED Provider Notes (Signed)
CSN: [id number]     Arrival date & time 07/06/13  2029 History  This chart was scribed for Joya Gaskins, MD by Bennett Scrape, ED Scribe. This patient was seen in room APA12/APA12 and the patient's care was started at 8:57 PM.   Chief Complaint  Patient presents with  . Foot Pain   Patient gave verbal permission to utilize photo for medical documentation only The image was not stored on any personal device   Patient is a 58 y.o. male presenting with lower extremity pain. The history is provided by the patient. No language interpreter was used.  Foot Pain This is a new problem. The current episode started more than 2 days ago. The problem occurs constantly. The problem has been gradually worsening. Pertinent negatives include no chest pain and no shortness of breath. The symptoms are aggravated by walking (touch). Treatments tried: Gabapentin  The treatment provided no relief.    HPI Comments: NARESH ALTHAUS is a 57 y.o. male who presents to the Emergency Department complaining of diffuse right foot pain with associated swelling and discoloration mainly around the right great toe that started 2 days ago. He states that he began having right great toe pain 4 days ago with what he thought was a ingrown toenail. Pt states that he cut his toenail into a "V" to make it heal on its own and antibiotic cream with no improvement. He denies any falls or injuries or drainage from the affected toe. Pt reports that he has neuropathy and denies similarities. He reports that he takes gabapentin for his neuropathic pain and admits that he has taken 1200 mg today with no improvement. He also states that he had 5 stents placed in the right leg by Dr. Hyacinth Meeker and Sweezer in [place] 3 weeks ago with no complications. He denies any fevers, CP, SOB or emesis as associated symptoms. He is on Plavix daily and denies any missed doses. He has a h/o DM and takes 1000 mg metformin BID. He denies being on any insulin.    Past Medical History  Diagnosis Date  . Hypertension   . Anemia   . AAA (abdominal aortic aneurysm)   . Heart murmur   . H/O hiatal hernia   . Chronic kidney disease     1 kidney functioning- 1 not functioning due to vascular  . Stroke 2000  . Pneumonia     Emphesma  . Anxiety   . CHF (congestive heart failure)   . MRSA (methicillin resistant Staphylococcus aureus) infection 2011    R foot  . Neuromuscular disorder     Neuropathy  . Sciatic pain   . Lower back pain   . Peripheral vascular disease   . Coronary artery disease 2007    CABG  3 vessels   Past Surgical History  Procedure Laterality Date  . Femoral artery stent  2002    left leg  by Dr. Brayton Layman  . Coronary artery bypass graft  2008  . Carotid endarterectomy  2000    right CEA  . Cardiac catheterization    . Endarterectomy  04/17/2011    Procedure: ENDARTERECTOMY CAROTID;  Surgeon: Nilda Simmer, MD;  Location: [facility] OR;  Service: Vascular;  Laterality: Left;  with patch angioplasty  . R leg artery stent     Family History  Problem Relation Age of Onset  . Hypertension Mother   . Cancer Mother     eye  . Other Brother  Carotid stenosis  . Anesthesia problems Neg Hx    History  Substance Use Topics  . Smoking status: Current Every Day Smoker -- 1.00 packs/day for 25 years    Types: Cigarettes    Last Attempt to Quit: 09/02/2010  . Smokeless tobacco: Not on file  . Alcohol Use: No    Review of Systems  Constitutional: Negative for fever.  Respiratory: Negative for shortness of breath.   Cardiovascular: Negative for chest pain.  Gastrointestinal: Negative for vomiting.  Musculoskeletal: Positive for arthralgias (right foot pain).  Skin: Positive for color change. Negative for wound.  All other systems reviewed and are negative.    Allergies  Review of patient's allergies indicates no known allergies.  Home Medications   Current Outpatient Rx  Name  Route  Sig  Dispense  Refill   . clopidogrel (PLAVIX) 75 MG tablet   Oral   Take 75 mg by mouth daily with breakfast.         . ALPRAZolam (XANAX) 0.5 MG tablet   Oral   Take 0.5 mg by mouth 2 (two) times daily.          Marland Kitchen amLODipine (NORVASC) 10 MG tablet   Oral   Take 10 mg by mouth daily.           Marland Kitchen aspirin 325 MG EC tablet   Oral   Take 325 mg by mouth daily.           . benazepril (LOTENSIN) 20 MG tablet   Oral   Take 20 mg by mouth daily.           . methadone (DOLOPHINE) 10 MG tablet   Oral   Take 10 mg by mouth. Two tablets 5 times daily ( 100 mg total daily)         . metoprolol succinate (TOPROL-XL) 25 MG 24 hr tablet   Oral   Take 25 mg by mouth 2 (two) times daily.          . nitroGLYCERIN (NITROSTAT) 0.4 MG SL tablet   Sublingual   Place 0.4 mg under the tongue every 5 (five) minutes as needed. For chest pain         . zolpidem (AMBIEN) 10 MG tablet   Oral   Take 10 mg by mouth at bedtime as needed. For sleep          Triage Vitals: BP 189/77  Pulse 85  Temp(Src) 97.7 F (36.5 C) (Oral)  Resp 20  Ht 5\' 9"  (1.753 m)  Wt 200 lb (90.719 kg)  BMI 29.52 kg/m2  SpO2 100%  Physical Exam  Nursing note and vitals reviewed.  CONSTITUTIONAL: Well developed/well nourished HEAD: Normocephalic/atraumatic ENMT: Mucous membranes moist NECK: supple no meningeal signs CV: S1/S2 noted, no murmurs/rubs/gallops noted LUNGS: Lungs are clear to auscultation bilaterally, no apparent distress ABDOMEN: soft, nontender, no rebound or guarding NEURO: Pt is awake/alert, moves all extremitiesx4 EXTREMITIES: pulses normal, full ROM, equal femoral, popliteal and DP pulses in bilateral LE, see pictures below  No puncture wounds noted to feet.  No crepitus noted.  No drainage noted SKIN: warm PSYCH: no abnormalities of mood noted              ED Course  Procedures   Medications  oxyCODONE-acetaminophen (PERCOCET/ROXICET) 5-325 MG per tablet 2 tablet (2 tablets Oral  Given 07/06/13 2134)  Ampicillin-Sulbactam (UNASYN) 3 g in sodium chloride 0.9 % 100 mL IVPB (0 g Intravenous Stopped 07/06/13 2336)  sulfamethoxazole-trimethoprim (  BACTRIM DS) 800-160 MG per tablet 1 tablet (1 tablet Oral Given 07/06/13 2135)  morphine 4 MG/ML injection 4 mg (4 mg Intravenous Given 07/06/13 2304)    DIAGNOSTIC STUDIES: Oxygen Saturation is 100% on RA, normal by my interpretation.    COORDINATION OF CARE: 9:14 PM-Discussed treatment plan which includes medications, x-ray of the right foot, CBC panel and BMP with pt at bedside and pt agreed to plan. Pt has a ride home.    Imaging/labs reassuring I suspect this is cellulitis given recent h/o ingrown toenail.  His feet are warm to touch and distal pulses intact.  I don't feel this is acute vascular issue.  Also, I doubt osteomyelitis/septic joint at this time.  Aside from right great toe, his feet look symmetric.  He denies pain in other toes or his left foot.    He requests pain meds.  He is seen by pain clinic but reports he can be given percocet for acute pain and will not break his contract.  I feel it is reasonable to give percocet for breakthrough pain (takes methadone daily)    Labs Review Labs Reviewed  BASIC METABOLIC PANEL - Abnormal; Notable for the following:    Glucose, Bld 111 (*)    GFR calc non Af Amer 66 (*)    GFR calc Af Amer 77 (*)    All other components within normal limits  CBC WITH DIFFERENTIAL - Abnormal; Notable for the following:    Hemoglobin 12.1 (*)    HCT 38.3 (*)    Eosinophils Relative 8 (*)    Eosinophils Absolute 0.8 (*)    All other components within normal limits  CBG MONITORING, ED - Abnormal; Notable for the following:    Glucose-Capillary 145 (*)    All other components within normal limits   Imaging Review Dg Foot Complete Right  07/06/2013   CLINICAL DATA:  Right foot pain for 3 days. Diffuse foot swelling and discoloration.  EXAM: RIGHT FOOT COMPLETE - 3+ VIEW  COMPARISON:  None.   FINDINGS: There is no evidence of fracture or dislocation. No osseous erosions are seen to suggest osteomyelitis at this time. The joint spaces are preserved. There is no evidence of talar subluxation; the subtalar joint is unremarkable in appearance.  No significant soft tissue abnormalities are seen.  IMPRESSION: No evidence of fracture or dislocation.  No osseous erosions seen.   Electronically Signed   By: Roanna RaiderJeffery  Chang M.D.   On: 07/06/2013 22:45      MDM   Final diagnoses:  Cellulitis of right foot    I personally performed the services described in this documentation, which was scribed in my presence. The recorded information has been reviewed and is accurate.        Joya Gaskinsonald W Solomon Skowronek, MD 07/07/13 [phone]

## 2013-07-10 ENCOUNTER — Encounter: Payer: Self-pay | Admitting: Podiatry

## 2013-07-10 ENCOUNTER — Ambulatory Visit (INDEPENDENT_AMBULATORY_CARE_PROVIDER_SITE_OTHER): Payer: Medicare Other | Admitting: Podiatry

## 2013-07-10 ENCOUNTER — Ambulatory Visit: Payer: Self-pay | Admitting: Podiatry

## 2013-07-10 VITALS — BP 160/62 | HR 87 | Temp 98.2°F | Resp 12

## 2013-07-10 DIAGNOSIS — I998 Other disorder of circulatory system: Secondary | ICD-10-CM

## 2013-07-10 DIAGNOSIS — I999 Unspecified disorder of circulatory system: Secondary | ICD-10-CM

## 2013-07-10 NOTE — Progress Notes (Signed)
   Subjective:    Patient ID: Thomas Roth, male    DOB: 1955-10-11, 58 y.o.   MRN: [id number]  HPI  PT STATED RT FOOT GREAT TOE IS SORE AND HAVE DISCOLORATION FOR  1 WEEK. THE TOE IS GETTING WORSE. THE TOE GET AGGRAVATED BY PRESSURE. THE DR. PRESCRIBE ANTIBIOTIC BUT IS NOT HELPING.    Review of Systems  Constitutional: Positive for fever and unexpected weight change.  Respiratory: Positive for shortness of breath.   Cardiovascular: Positive for leg swelling.  Musculoskeletal: Positive for back pain and gait problem.  Skin: Positive for color change.  Neurological: Positive for dizziness, numbness and headaches.  Hematological: Bruises/bleeds easily.  Psychiatric/Behavioral: The patient is nervous/anxious.   All other systems reviewed and are negative.      Objective:   Physical Exam I have reviewed his past medical history medications allergies surgeries social history and review of systems. Vital signs are stable he is alert and oriented x3. Pulses are strongly palpable bilateral lower extremity the right foot however does demonstrate mottling with edema. Neurologic sensorium is decreased per since once the monofilament deep tendon reflexes are not elicitable. Orthopedic evaluation demonstrates all joints distal to the ankle a full range of motion the left foot minimally so on the right foot did secondary to pain. Right foot demonstrates what appears to be ischemia to the hallux in particular and then the lesser digits. The ischemia is severe and is more than likely from emboli has plaque fractures from the wall of the superficial femoral artery. He has had 4 stents placed in his right SFA in the past 3 weeks. Cutaneous evaluation does demonstrates normal well hydrated cutis left as well as the dorsum of the foot and plantar aspect of the right foot to the level of the midfoot. Past the midfoot it is mottled until we reached the metatarsophalangeal joints. And then we has severe  necrosis.        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 Right  Plan: Discussed etiology pathology conservative versus surgical therapies. We made 2 phone calls to his doctor and dental [place] st[place]. We were told that his nurse or he would be calling us back and at the time of this dictation 4 hours later we have not heard from their office. I referred him back to his doctor in [place].

## 2013-08-07 ENCOUNTER — Telehealth: Payer: Self-pay

## 2013-08-07 NOTE — Telephone Encounter (Signed)
Phone call from pt.  Reports he has a new ulcer on the left 4th toe; draining yellow drainage.  C/o pain in the site with the ulcer, only.  Denies left leg pain.  States he saw the Podiatrist recently, Dr. Javier GlazierMichael Canavan in [place], and was informed that the pulse in the left leg was weaker than the right leg.  Was advised to call and schedule appt. with his vascular doctor.   Relates history that he had "5 stents" placed in the right leg on 06/18/13 [place] and a week after the stent placement, had to have his right great toe amputated due to severe pain; reports he was told that a small piece of plaque could have floated down and caused a blockage in a small vessel near the toe, following his stent procedure.  States his right leg feels okay, following the stents being placed.  Pt. stated he does not want to return to the MD in [place] that performed the recent angiogram. Advised will schedule appt. With Dr. Imogene Burnhen with [facility] , following receiving his medical information from the podiatrist.  Also, advised pt. to bring a copy of the angiogram on a disc for Dr. Imogene Burnhen to view.  Advised will contact him with an appt.  Agrees with plan.

## 2013-08-14 ENCOUNTER — Other Ambulatory Visit: Payer: Self-pay | Admitting: *Deleted

## 2013-08-14 ENCOUNTER — Encounter: Payer: Self-pay | Admitting: Vascular Surgery

## 2013-08-14 DIAGNOSIS — L97909 Non-pressure chronic ulcer of unspecified part of unspecified lower leg with unspecified severity: Secondary | ICD-10-CM

## 2013-08-14 DIAGNOSIS — I739 Peripheral vascular disease, unspecified: Secondary | ICD-10-CM

## 2013-08-15 ENCOUNTER — Ambulatory Visit (HOSPITAL_COMMUNITY)
Admission: RE | Admit: 2013-08-15 | Discharge: 2013-08-15 | Disposition: A | Discharge status: Home / Self Care | Payer: Medicare Other | Source: Ambulatory Visit | Attending: Vascular Surgery | Admitting: Vascular Surgery

## 2013-08-15 ENCOUNTER — Ambulatory Visit (INDEPENDENT_AMBULATORY_CARE_PROVIDER_SITE_OTHER): Payer: Medicare Other | Admitting: Vascular Surgery

## 2013-08-15 ENCOUNTER — Other Ambulatory Visit: Payer: Self-pay

## 2013-08-15 ENCOUNTER — Encounter: Payer: Self-pay | Admitting: Vascular Surgery

## 2013-08-15 VITALS — BP 179/66 | HR 61 | Resp 14 | Ht 69.0 in | Wt 198.0 lb

## 2013-08-15 DIAGNOSIS — I6529 Occlusion and stenosis of unspecified carotid artery: Secondary | ICD-10-CM

## 2013-08-15 DIAGNOSIS — L98499 Non-pressure chronic ulcer of skin of other sites with unspecified severity: Secondary | ICD-10-CM

## 2013-08-15 DIAGNOSIS — L97909 Non-pressure chronic ulcer of unspecified part of unspecified lower leg with unspecified severity: Secondary | ICD-10-CM

## 2013-08-15 DIAGNOSIS — Z01810 Encounter for preprocedural cardiovascular examination: Secondary | ICD-10-CM

## 2013-08-15 DIAGNOSIS — I7025 Atherosclerosis of native arteries of other extremities with ulceration: Secondary | ICD-10-CM | POA: Insufficient documentation

## 2013-08-15 DIAGNOSIS — I739 Peripheral vascular disease, unspecified: Secondary | ICD-10-CM | POA: Insufficient documentation

## 2013-08-15 NOTE — Addendum Note (Signed)
Addended by: Sharee PimpleMCCHESNEY, MARILYN K on: 08/15/2013 01:14 PM   Modules accepted: Orders

## 2013-08-15 NOTE — Progress Notes (Addendum)
Referred by:  Francesca JewettMichael T Canavan, DPM [address] [place], [place] [id number]  Reason for referral: left foot ulcer  History of Present Illness  Thomas FantiDavid K [place] is a 58 y.o. (01-05-1956) male who presents with chief complaint: left foot ulcer.  This patient is known from a L CEA completed (04/17/11).  He has been lost to follow up due to his primary residence in [place], [place].  Recently, he has been having what sounds like intermittent claudication symptoms resulting in a recent angiogram with intervention in the right leg with multiple SFA stents.  Reportedly, this patient has also had a L CIA stent placed previously.  The records are not available, but it sounds like he might have had an embolization to his right foot resulting in a right great toe amputation.  At the time of his procedure, he developed also blistering in left 4th toe.  This eventually worsened lead to loss of skin in the left 4th toe.  At this point, he has some post-operative pain in his right great toe and left 4th toe.  Pain is described as aching, severity 3-6/10, and associated with manipulating the associated toe or wound.  Patient has attempted to treat this pain with rest.  The patient has no rest pain symptoms.  He notes only serous drainage from the left 4th toe wound.  Atherosclerotic risk factors include: HTN, prior smoking.    In regards to his carotids, the patient denies any CVA or TIA sx.  Past Medical History  Diagnosis Date  . Hypertension   . Anemia   . AAA (abdominal aortic aneurysm)   . Heart murmur   . H/O hiatal hernia   . Chronic kidney disease     1 kidney functioning- 1 not functioning due to vascular  . Stroke 2000  . Pneumonia     Emphesma  . Anxiety   . CHF (congestive heart failure)   . MRSA (methicillin resistant Staphylococcus aureus) infection 2011    R foot  . Neuromuscular disorder     Neuropathy  . Sciatic pain   . Lower back pain   . Peripheral vascular disease   .  Coronary artery disease 2007    CABG  3 vessels    Past Surgical History  Procedure Laterality Date  . Femoral artery stent  2002    left leg  by Dr. Brayton LaymanJohn York  . Coronary artery bypass graft  2008  . Carotid endarterectomy  2000    right CEA  . Cardiac catheterization    . Endarterectomy  04/17/2011    Procedure: ENDARTERECTOMY CAROTID;  Surgeon: Nilda SimmerBrian Liang-Yu Sherese Heyward, MD;  [facility];  Service: Vascular;  Laterality: Left;  with patch angioplasty  . R leg artery stent      History   Social History  . Marital Status: Married    Spouse Name: N/A    Number of Children: N/A  . Years of Education: N/A   Occupational History  . Not on file.   Social History Main Topics  . Smoking status: Former Smoker -- 1.00 packs/day for 25 years    Types: Cigarettes    Quit date: 06/15/2013  . Smokeless tobacco: Not on file  . Alcohol Use: No  . Drug Use: No  . Sexual Activity: Not on file   Other Topics Concern  . Not on file   Social History Narrative  . No narrative on file    Family History  Problem Relation Age of Onset  . Hypertension Mother   . Cancer Mother     eye  . Other Brother     Carotid stenosis  . Anesthesia problems Neg Hx      Current Outpatient Prescriptions on File Prior to Visit  Medication Sig Dispense Refill  . ALPRAZolam (XANAX) 0.5 MG tablet Take 1 mg by mouth at bedtime.       Marland Kitchen amLODipine (NORVASC) 10 MG tablet Take 10 mg by mouth daily.        Marland Kitchen aspirin 325 MG EC tablet Take 325 mg by mouth daily.        . clopidogrel (PLAVIX) 75 MG tablet Take 75 mg by mouth daily with breakfast.      . methadone (DOLOPHINE) 10 MG tablet Take 20 mg by mouth 4 (four) times daily.       . metoprolol tartrate (LOPRESSOR) 25 MG tablet Take 50 mg by mouth every morning.      . nitroGLYCERIN (NITROSTAT) 0.4 MG SL tablet Place 0.4 mg under the tongue every 5 (five) minutes as needed. For chest pain      . zolpidem (AMBIEN) 10 MG tablet Take 10 mg by mouth at  bedtime as needed. For sleep      . amoxicillin-clavulanate (AUGMENTIN) 875-125 MG per tablet Take 1 tablet by mouth 2 (two) times daily. One po bid x 7 days  14 tablet  0  . metFORMIN (GLUCOPHAGE) 1000 MG tablet Take 1,000 mg by mouth 2 (two) times daily with a meal.      . oxyCODONE-acetaminophen (PERCOCET/ROXICET) 5-325 MG per tablet Take 1 tablet by mouth every 4 (four) hours as needed for severe pain.  10 tablet  0   No current facility-administered medications on file prior to visit.    No Known Allergies  REVIEW OF SYSTEMS:  (Positives checked otherwise negative)  CARDIOVASCULAR:  []  chest pain, []  chest pressure, []  palpitations, []  shortness of breath when laying flat, []  shortness of breath with exertion,  [x]  pain in feet when walking, [x]  pain in feet when laying flat, []  history of blood clot in veins (DVT), []  history of phlebitis, [x]  swelling in legs, []  varicose veins  PULMONARY:  []  productive cough, []  asthma, []  wheezing  NEUROLOGIC:  []  weakness in arms or legs, []  numbness in arms or legs, []  difficulty speaking or slurred speech, []  temporary loss of vision in one eye, []  dizziness  HEMATOLOGIC:  []  bleeding problems, []  problems with blood clotting too easily  MUSCULOSKEL:  []  joint pain, []  joint swelling  GASTROINTEST:  []  vomiting blood, []  blood in stool     GENITOURINARY:  []  burning with urination, []  blood in urine  PSYCHIATRIC:  []  history of major depression  INTEGUMENTARY:  []  rashes, [x]  ulcers  CONSTITUTIONAL:  []  fever, []  chills  For VQI Use Only  PRE-ADM LIVING: Home  AMB STATUS: Ambulatory  CAD Sx: None  PRIOR CHF: Asymptomatic, History of CHF  STRESS TEST: [x]  No, [ ]  Normal, [ ]  + ischemia, [ ]  + MI, [ ]  Both  Physical Examination  Filed Vitals:   08/15/13 1138  BP: 179/66  Pulse: 61  Resp: 14  Height: 5\' 9"  (1.753 m)  Weight: 198 lb (89.812 kg)   Body mass index is 29.23 kg/(m^2).  General: A&O x 3, WDWN  Head:  /AT  Ear/Nose/Throat: Hearing grossly intact, nares w/o erythema or drainage, oropharynx w/o Erythema/Exudate, Mallampati score:   Eyes: PERRLA,  EOMI  Neck: Supple, no nuchal rigidity, no palpable LAD, B incision well healed  Pulmonary: Sym exp, good air movt, CTAB, no rales, rhonchi, & wheezing  Cardiac: RRR, Nl S1, S2, no Murmurs, rubs or gallops  Vascular: Vessel Right Left  Radial Palpable Palpable  Ulnar Palpable Palpable  Brachial Palpable Palpable  Carotid Palpable, without bruit Palpable, without bruit  Aorta Not palpable N/A  Femoral Palpable Palpable  Popliteal Not palpable Not palpable  PT Not Palpable Not Palpable  DP Not Palpable Not Palpable   Gastrointestinal: soft, NTND, -G/R, - HSM, - masses, - CVAT B  Musculoskeletal: M/S 5/5 throughout , R great toe amp healing, cyanotic toes bilaterally, L 4th with dry gangrenous appearance, BLE edema 2+  Neurologic: CN 2-12 intact , Pain and light touch intact in extremities , Motor exam as listed above  Psychiatric: Judgment intact, Mood & affect appropriate for pt's clinical situation  Dermatologic: See M/S exam for extremity exam, no rashes otherwise noted  Lymph : No Cervical, Axillary, or Inguinal lymphadenopathy   Non-Invasive Vascular Imaging  ABI (Date: 08/15/2013)  R: 1.0 (0.70), DP: bi, PT: bi, TBI: N/A  L: 0.97 (1.02), DP: mono, PT: bi, TBI: 0.58  Medical Decision Making  Thomas FantiDavid K Saathoff is a 58 y.o. male who presents with: LLE critical limb ischemia, s/p B CEA, BLE CVI (C2)   I suspect some degree of this patient's cyanosis is his CVI but there is substantial tissue loss in the left 4th toe worrisome for PAD distal to his ankles.  He is already demonstrating medial calcification in his tibial arteries so the ABI can be misleading in his case.  I discussed with the patient the natural history of critical limb ischemia: 25% require amputation in one year, 50% are able to maintain their limbs in one  year, and 25-30% die in one year due to comorbidities.  Given the limb threatening status of this patient, I recommend an aggressive work up including proceeding with an: Aortogram, Bilateral runoff and possible LLE intervention. I discussed with the patient the nature of angiographic procedures, especially the limited patencies of any endovascular intervention. The patient is aware of that the risks of an angiographic procedure include but are not limited to: bleeding, infection, access site complications, embolization, rupture of treated vessel, dissection, possible need for emergent surgical intervention, and possible need for surgical procedures to treat the patient's pathology. The patient is aware of the risks and agrees to proceed.  The procedure is scheduled for: 18 APR 15.  I discussed in depth with the patient the nature of atherosclerosis, and emphasized the importance of maximal medical management including strict control of blood pressure, blood glucose, and lipid levels, antiplatelet agents, obtaining regular exercise, and cessation of smoking.  The patient is aware that without maximal medical management the underlying atherosclerotic disease process will progress, limiting the benefit of any interventions. The patient is currently not on a statin.  I will discuss this with the patient post-procedure. The patient is currently on an anti-platelet: Plavix and ASA. In regards to the CVI, I have ordered a BLE venous reflux duplex for 1-2 weeks upon follow-up. In regards to his carotid disease, B carotid duplex are ordered for the same 1-2 week follow-up.  Thank you for allowing us to participate in this patient's care.  Leonides SakeBrian Wilfred Dayrit, MD [facility] [place] Office: [phone] Pager: [phone]  08/15/2013, 12:31 PM  VASCULAR QUALITY INITIATIVE FOLLOW UP DATA:  Current smoker: [  ]  yes  [ x ] no  Living status: [ x ]  Home  [  ] Nursing home  [  ]  Homeless    MEDS:  ASA [ x ] yes  [  ] no- [  ] medical reason  [  ] non compliant  STATIN  [  ] yes  [  ] no- [ x] medical reason  [  ] non compliant  Beta blocker [  x] yes  [  ] no- [  ] medical reason  [  ] non compliant  ACE inhibitor [  ] yes  [ x ] no- [  ] medical reason  [  ] non compliant  P2Y12 Antagonist [ x ] none  [  ] clopidogrel-Plavix  [  ] ticlopidine-Ticlid   [  ] prasugrel-Effient  [  ] ticagrelor- Brilinta    Anticoagulant [ x ] None  [  ] warfarin  [  ] rivaroxaban-Xarelto [  ] dabigatran- Pradaxa  Neurologic event since D/C:  [ x ] no  [  ] yes: [  ] eye event  [  ] cortical event  [  ] VB event  [  ] non specific event  [  ] right  [  ] left  [  ] TIA  [  ] stroke  Date:   Modified Rankin Score: 0  MI since D/C: [ x ] no  [  ] troponin only  [  ] EKG or clinical  Cranial nerve injury: [  x] none  [  ] resolved  [  ] persistent  Duplex CEA site: [ x ] no  [  ] yes (ordered for next follow-up)  CEA site re-operation:  [ x ] no   [  ] yes- date of re-op:  CEA site PCI:   [ x ] no   [  ] yes- date of PCI:

## 2013-08-18 ENCOUNTER — Encounter (HOSPITAL_COMMUNITY)
Admission: RE | Disposition: A | Discharge status: Home / Self Care | Payer: Self-pay | Source: Ambulatory Visit | Attending: Vascular Surgery

## 2013-08-18 ENCOUNTER — Ambulatory Visit (HOSPITAL_COMMUNITY)
Admission: RE | Admit: 2013-08-18 | Discharge: 2013-08-18 | Disposition: A | Discharge status: Home / Self Care | Payer: Medicare Other | Source: Ambulatory Visit | Attending: Vascular Surgery | Admitting: Vascular Surgery

## 2013-08-18 ENCOUNTER — Encounter (HOSPITAL_COMMUNITY): Payer: Self-pay | Admitting: Pharmacy Technician

## 2013-08-18 DIAGNOSIS — T82898A Other specified complication of vascular prosthetic devices, implants and grafts, initial encounter: Secondary | ICD-10-CM | POA: Insufficient documentation

## 2013-08-18 DIAGNOSIS — N189 Chronic kidney disease, unspecified: Secondary | ICD-10-CM | POA: Insufficient documentation

## 2013-08-18 DIAGNOSIS — I129 Hypertensive chronic kidney disease with stage 1 through stage 4 chronic kidney disease, or unspecified chronic kidney disease: Secondary | ICD-10-CM | POA: Insufficient documentation

## 2013-08-18 DIAGNOSIS — I96 Gangrene, not elsewhere classified: Secondary | ICD-10-CM | POA: Insufficient documentation

## 2013-08-18 DIAGNOSIS — Y831 Surgical operation with implant of artificial internal device as the cause of abnormal reaction of the patient, or of later complication, without mention of misadventure at the time of the procedure: Secondary | ICD-10-CM | POA: Insufficient documentation

## 2013-08-18 DIAGNOSIS — Z951 Presence of aortocoronary bypass graft: Secondary | ICD-10-CM | POA: Insufficient documentation

## 2013-08-18 DIAGNOSIS — F411 Generalized anxiety disorder: Secondary | ICD-10-CM | POA: Insufficient documentation

## 2013-08-18 DIAGNOSIS — I2510 Atherosclerotic heart disease of native coronary artery without angina pectoris: Secondary | ICD-10-CM | POA: Insufficient documentation

## 2013-08-18 DIAGNOSIS — I70269 Atherosclerosis of native arteries of extremities with gangrene, unspecified extremity: Secondary | ICD-10-CM

## 2013-08-18 DIAGNOSIS — L97509 Non-pressure chronic ulcer of other part of unspecified foot with unspecified severity: Secondary | ICD-10-CM | POA: Insufficient documentation

## 2013-08-18 DIAGNOSIS — I708 Atherosclerosis of other arteries: Secondary | ICD-10-CM | POA: Insufficient documentation

## 2013-08-18 DIAGNOSIS — I509 Heart failure, unspecified: Secondary | ICD-10-CM | POA: Insufficient documentation

## 2013-08-18 HISTORY — PX: ABDOMINAL AORTAGRAM: SHX5454

## 2013-08-18 LAB — POCT I-STAT, CHEM 8
BUN: 30 mg/dL — AB (ref 6–23)
CREATININE: 1.6 mg/dL — AB (ref 0.50–1.35)
Calcium, Ion: 1.29 mmol/L — ABNORMAL HIGH (ref 1.12–1.23)
Chloride: 101 mEq/L (ref 96–112)
Glucose, Bld: 122 mg/dL — ABNORMAL HIGH (ref 70–99)
HCT: 36 % — ABNORMAL LOW (ref 39.0–52.0)
Hemoglobin: 12.2 g/dL — ABNORMAL LOW (ref 13.0–17.0)
Potassium: 4.1 mEq/L (ref 3.7–5.3)
SODIUM: 139 meq/L (ref 137–147)
TCO2: 25 mmol/L (ref 0–100)

## 2013-08-18 LAB — GLUCOSE, CAPILLARY: Glucose-Capillary: 104 mg/dL — ABNORMAL HIGH (ref 70–99)

## 2013-08-18 SURGERY — ABDOMINAL AORTAGRAM
Anesthesia: LOCAL

## 2013-08-18 MED ORDER — LIDOCAINE HCL (PF) 1 % IJ SOLN
INTRAMUSCULAR | Status: AC
Start: 2013-08-18 — End: 2013-08-18
  Filled 2013-08-18: qty 30

## 2013-08-18 MED ORDER — SODIUM CHLORIDE 0.9 % IV SOLN: INTRAVENOUS | Status: DC

## 2013-08-18 MED ORDER — HEPARIN (PORCINE) IN NACL 2-0.9 UNIT/ML-% IJ SOLN
INTRAMUSCULAR | Status: AC
  Filled 2013-08-18: qty 1000

## 2013-08-18 MED ORDER — OXYCODONE-ACETAMINOPHEN 5-325 MG PO TABS: 1.0000 | ORAL_TABLET | ORAL | Status: DC | PRN

## 2013-08-18 MED ORDER — SODIUM CHLORIDE 0.9 % IV SOLN: 1.0000 mL/kg/h | INTRAVENOUS | Status: DC

## 2013-08-18 MED ORDER — MIDAZOLAM HCL 2 MG/2ML IJ SOLN
INTRAMUSCULAR | Status: AC
Start: 2013-08-18 — End: 2013-08-18
  Filled 2013-08-18: qty 2

## 2013-08-18 MED ORDER — FENTANYL CITRATE 0.05 MG/ML IJ SOLN
INTRAMUSCULAR | Status: AC
  Filled 2013-08-18: qty 2

## 2013-08-18 MED ORDER — MORPHINE SULFATE 2 MG/ML IJ SOLN
2.0000 mg | INTRAMUSCULAR | Status: DC | PRN
Start: 2013-08-18 — End: 2013-08-20

## 2013-08-18 NOTE — Discharge Instructions (Signed)

## 2013-08-18 NOTE — H&P (View-Only) (Signed)
Referred by:  Francesca JewettMichael T [place], DPM [address] [place], [place] [id number]  Reason for referral: left foot ulcer  History of Present Illness  Thomas FantiDavid K Footman is a [age] y.o. (1955-05-08) male who presents with chief complaint: left foot ulcer.  This patient is known from a L CEA completed (04/17/11).  He has been lost to follow up due to his primary residence in [place], [place].  Recently, he has been having what sounds like intermittent claudication symptoms resulting in a recent angiogram with intervention in the right leg with multiple SFA stents.  Reportedly, this patient has also had a L CIA stent placed previously.  The records are not available, but it sounds like he might have had an embolization to his right foot resulting in a right great toe amputation.  At the time of his procedure, he developed also blistering in left 4th toe.  This eventually worsened lead to loss of skin in the left 4th toe.  At this point, he has some post-operative pain in his right great toe and left 4th toe.  Pain is described as aching, severity 3-6/10, and associated with manipulating the associate toe or wound.  Patient has attempted to treat this pain with rest.  The patient has no rest pain symptoms.  He notes only serous drainage from the elft 4th toe wound.  Atherosclerotic risk factors include: HTN, prior smoking.    In regards to his carotids, the patient denies any CVA or TIA sx.  Past Medical History  Diagnosis Date  . Hypertension   . Anemia   . AAA (abdominal aortic aneurysm)   . Heart murmur   . H/O hiatal hernia   . Chronic kidney disease     1 kidney functioning- 1 not functioning due to vascular  . Stroke 2000  . Pneumonia     Emphesma  . Anxiety   . CHF (congestive heart failure)   . MRSA (methicillin resistant Staphylococcus aureus) infection 2011    R foot  . Neuromuscular disorder     Neuropathy  . Sciatic pain   . Lower back pain   . Peripheral vascular disease   .  Coronary artery disease 2007    CABG  3 vessels    Past Surgical History  Procedure Laterality Date  . Femoral artery stent  2002    left leg  by Dr. Brayton LaymanJohn York  . Coronary artery bypass graft  2008  . Carotid endarterectomy  2000    right CEA  . Cardiac catheterization    . Endarterectomy  04/17/2011    Procedure: ENDARTERECTOMY CAROTID;  Surgeon: Nilda SimmerBrian Liang-Yu Rane Blitch, MD;  [facility];  Service: Vascular;  Laterality: Left;  with patch angioplasty  . R leg artery stent      History   Social History  . Marital Status: Married    Spouse Name: N/A    Number of Children: N/A  . Years of Education: N/A   Occupational History  . Not on file.   Social History Main Topics  . Smoking status: Former Smoker -- 1.00 packs/day for 25 years    Types: Cigarettes    Quit date: 06/15/2013  . Smokeless tobacco: Not on file  . Alcohol Use: No  . Drug Use: No  . Sexual Activity: Not on file   Other Topics Concern  . Not on file   Social History Narrative  . No narrative on file    Family History  Problem Relation Age of Onset  . Hypertension Mother   . Cancer Mother     eye  . Other Brother     Carotid stenosis  . Anesthesia problems Neg Hx      Current Outpatient Prescriptions on File Prior to Visit  Medication Sig Dispense Refill  . ALPRAZolam (XANAX) 0.5 MG tablet Take 1 mg by mouth at bedtime.       Marland Kitchen amLODipine (NORVASC) 10 MG tablet Take 10 mg by mouth daily.        Marland Kitchen aspirin 325 MG EC tablet Take 325 mg by mouth daily.        . clopidogrel (PLAVIX) 75 MG tablet Take 75 mg by mouth daily with breakfast.      . methadone (DOLOPHINE) 10 MG tablet Take 20 mg by mouth 4 (four) times daily.       . metoprolol tartrate (LOPRESSOR) 25 MG tablet Take 50 mg by mouth every morning.      . nitroGLYCERIN (NITROSTAT) 0.4 MG SL tablet Place 0.4 mg under the tongue every 5 (five) minutes as needed. For chest pain      . zolpidem (AMBIEN) 10 MG tablet Take 10 mg by mouth at  bedtime as needed. For sleep      . amoxicillin-clavulanate (AUGMENTIN) 875-125 MG per tablet Take 1 tablet by mouth 2 (two) times daily. One po bid x 7 days  14 tablet  0  . metFORMIN (GLUCOPHAGE) 1000 MG tablet Take 1,000 mg by mouth 2 (two) times daily with a meal.      . oxyCODONE-acetaminophen (PERCOCET/ROXICET) 5-325 MG per tablet Take 1 tablet by mouth every 4 (four) hours as needed for severe pain.  10 tablet  0   No current facility-administered medications on file prior to visit.    No Known Allergies  REVIEW OF SYSTEMS:  (Positives checked otherwise negative)  CARDIOVASCULAR:  []  chest pain, []  chest pressure, []  palpitations, []  shortness of breath when laying flat, []  shortness of breath with exertion,  [x]  pain in feet when walking, [x]  pain in feet when laying flat, []  history of blood clot in veins (DVT), []  history of phlebitis, [x]  swelling in legs, []  varicose veins  PULMONARY:  []  productive cough, []  asthma, []  wheezing  NEUROLOGIC:  []  weakness in arms or legs, []  numbness in arms or legs, []  difficulty speaking or slurred speech, []  temporary loss of vision in one eye, []  dizziness  HEMATOLOGIC:  []  bleeding problems, []  problems with blood clotting too easily  MUSCULOSKEL:  []  joint pain, []  joint swelling  GASTROINTEST:  []  vomiting blood, []  blood in stool     GENITOURINARY:  []  burning with urination, []  blood in urine  PSYCHIATRIC:  []  history of major depression  INTEGUMENTARY:  []  rashes, [x]  ulcers  CONSTITUTIONAL:  []  fever, []  chills  For VQI Use Only  PRE-ADM LIVING: Home  AMB STATUS: Ambulatory  CAD Sx: None  PRIOR CHF: Asymptomatic, History of CHF  STRESS TEST: [x]  No, [ ]  Normal, [ ]  + ischemia, [ ]  + MI, [ ]  Both  Physical Examination  Filed Vitals:   08/15/13 1138  BP: 179/66  Pulse: 61  Resp: 14  Height: 5\' 9"  (1.753 m)  Weight: 198 lb (89.812 kg)   Body mass index is 29.23 kg/(m^2).  General: A&O x 3, WDWN  Head:  [place]/AT  Ear/Nose/Throat: Hearing grossly intact, nares w/o erythema or drainage, oropharynx w/o Erythema/Exudate, Mallampati score:   Eyes: PERRLA,  EOMI  Neck: Supple, no nuchal rigidity, no palpable LAD, B incision well healed  Pulmonary: Sym exp, good air movt, CTAB, no rales, rhonchi, & wheezing  Cardiac: RRR, Nl S1, S2, no Murmurs, rubs or gallops  Vascular: Vessel Right Left  Radial Palpable Palpable  Ulnar Palpable Palpable  Brachial Palpable Palpable  Carotid Palpable, without bruit Palpable, without bruit  Aorta Not palpable N/A  Femoral Palpable Palpable  Popliteal Not palpable Not palpable  PT Not Palpable Not Palpable  DP Not Palpable Not Palpable   Gastrointestinal: soft, NTND, -G/R, - HSM, - masses, - CVAT B  Musculoskeletal: M/S 5/5 throughout , R great toe amp healing, cyanotic toes bilaterally, L 4th with dry gangrenous appearance, BLE edema 2+  Neurologic: CN 2-12 intact , Pain and light touch intact in extremities , Motor exam as listed above  Psychiatric: Judgment intact, Mood & affect appropriate for pt's clinical situation  Dermatologic: See M/S exam for extremity exam, no rashes otherwise noted  Lymph : No Cervical, Axillary, or Inguinal lymphadenopathy   Non-Invasive Vascular Imaging  ABI (Date: 08/15/2013)  R: 1.0 (0.70), DP: bi, PT: bi, TBI: N/A  L: 0.97 (1.02), DP: mono, PT: bi, TBI: 0.58  Medical Decision Making  RICCO DERSHEM is a 59 y.o. male who presents with: LLE critical limb ischemia, s/p B CEA, BLE CVI (C2)   I suspect some degree of patient's cyanosis is his CVI but there is substantial tissue loss in the left 4th toe worrisome for PAD distal to his ankles.  He is already demonstrating medial calcification in his tibial arteries so the ABI can be misleading in his case.  I discussed with the patient the natural history of critical limb ischemia: 25% require amputation in one year, 50% are able to maintain their limbs in one year, and  25-30% die in one year due to comorbidities.  Given the limb threatening status of this patient, I recommend an aggressive work up including proceeding with an: Aortogram, Bilateral runoff and possible LLE intervention. I discussed with the patient the nature of angiographic procedures, especially the limited patencies of any endovascular intervention. The patient is aware of that the risks of an angiographic procedure include but are not limited to: bleeding, infection, access site complications, embolization, rupture of treated vessel, dissection, possible need for emergent surgical intervention, and possible need for surgical procedures to treat the patient's pathology. The patient is aware of the risks and agrees to proceed.  The procedure is scheduled for: 18 APR 15.  I discussed in depth with the patient the nature of atherosclerosis, and emphasized the importance of maximal medical management including strict control of blood pressure, blood glucose, and lipid levels, antiplatelet agents, obtaining regular exercise, and cessation of smoking.  The patient is aware that without maximal medical management the underlying atherosclerotic disease process will progress, limiting the benefit of any interventions. The patient is currently not on a statin.  I will discuss this with the patient post-procedure. The patient is currently on an anti-platelet: Plavix and ASA. In regards to the CVI, I have ordered a BLE venous reflux duplex for 1-2 weeks upon follow-up. In regards to his carotid disease, B carotid duplex are ordered for the same 1-2 week follow-up.  Thank you for allowing [place] to participate in this patient's care.  Leonides Sake, MD [facility] [place] Office: [phone] Pager: [phone]  08/15/2013, 12:31 PM  VASCULAR QUALITY INITIATIVE FOLLOW UP DATA:  Current smoker: [  ]  yes  [ x ] no  Living status: [ x ]  Home  [  ] Nursing home  [  ] Homeless     MEDS:  ASA [ x ] yes  [  ] no- [  ] medical reason  [  ] non compliant  STATIN  [  ] yes  [  ] no- [ x] medical reason  [  ] non compliant  Beta blocker [  x] yes  [  ] no- [  ] medical reason  [  ] non compliant  ACE inhibitor [  ] yes  [ x ] no- [  ] medical reason  [  ] non compliant  P2Y12 Antagonist [ x ] none  [  ] clopidogrel-Plavix  [  ] ticlopidine-Ticlid   [  ] prasugrel-Effient  [  ] ticagrelor- Brilinta    Anticoagulant [ x ] None  [  ] warfarin  [  ] rivaroxaban-Xarelto [  ] dabigatran- Pradaxa  Neurologic event since D/C:  [ x ] no  [  ] yes: [  ] eye event  [  ] cortical event  [  ] VB event  [  ] non specific event  [  ] right  [  ] left  [  ] TIA  [  ] stroke  Date:   Modified Rankin Score: 0  MI since D/C: [ x ] no  [  ] troponin only  [  ] EKG or clinical  Cranial nerve injury: [  x] none  [  ] resolved  [  ] persistent  Duplex CEA site: [ x ] no  [  ] yes (ordered for next follow-up)  CEA site re-operation:  [ x ] no   [  ] yes- date of re-op:  CEA site PCI:   [ x ] no   [  ] yes- date of PCI:

## 2013-08-18 NOTE — Op Note (Addendum)
OPERATIVE NOTE   PROCEDURE: 1.  Right common femoral artery cannulation under ultrasound guidance 2.  Placement of catheter in aorta 3.  Aortogram 4.  Second order arterial selection 5.  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  POST-OPERATIVE DIAGNOSIS: same as above   SURGEON: Thomas SakeBrian Emeterio Balke, MD  ANESTHESIA: conscious sedation  ESTIMATED BLOOD LOSS: 50 cc  CONTRAST: 80 cc  FINDING(S):  Aorta: patent but disease with multiple stenoses, greatest ~50%  Right Left  RA Not visualized Not visualized  CIA Patent but disease and calcified with exophyactic plaque with >30% stenosis Patent common iliac artery stent with in-stent restenosis distally ~50%  EIA Patent Patent  IIA Occluded Patent  CFA Patent Patent  SFA Stents evident throughout this right superficial femoral artery Patent  PFA  Patent  Pop  Patent  Trif  Patent  AT  Patent, delayed runoff to foot  Pero  Patent, delayed runoff to foot  PT  Patent, dominant runoff to foot  Limited digital artery flow visualized on left lateral foot.  SPECIMEN(S):  none  INDICATIONS:   Thomas Roth is a 58 y.o. 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  The patient presents for: aortogram, possible left leg runoff.  I discussed with the patient the nature of angiographic procedures, especially the limited patencies of any endovascular intervention.  The patient is aware of that the risks of an angiographic procedure include but are not limited to: bleeding, infection, access site complications, renal failure, embolization, rupture of vessel, dissection, possible need for emergent surgical intervention, possible need for surgical procedures to treat the patient's pathology, and stroke and death.  The patient is aware of the risks and agrees to proceed.  DESCRIPTION: After full informed consent was obtained from the patient, the patient was brought back to the angiography suite.  The patient was placed supine  upon the angiography table and connected to monitoring equipment.  The patient was then given conscious sedation, the amounts of which are documented in the patient's chart.  The patient was prepped and drape in the standard fashion for an angiographic procedure.  At this point, attention was turned to the right groin.  Under ultrasound guidance, the right common femoral artery was cannulated with a micropuncture needle.  The microwire was advanced into the iliac arterial system.  The needle was exchanged for a microsheath, which was loaded into the common femoral artery over the wire.  The microwire was exchanged for a [facility] which was advanced into the aorta.  The microsheath was then exchanged for a 5-Fr sheath which was loaded into the common femoral artery.  The Omniflush catheter was then loaded over the wire up to the level of L1.  The catheter was connected to the power injector circuit.  After de-airring and de-clotting the circuit, a power injector aortogram was completed.    The [facility] wire was replaced in the catheter, and using the [facility] and Omniflush catheter, the left common iliac artery was selected.  The wire would not advance into the left external iliac artery so I exchanged it for a Glidewire which I advanced into the left common femoral artery.  The catheter was exchanged for an endhole catheter which was advanced with some difficulty into the left external iliac artery.  I connected the left catheter to the power injector circuit and did a left leg runoff.  The findings are listed above.  I also did a left lateral foot injection  to try to image the digital arteries in the left foot.  The findings are listed above.  Neither the aortic stenosis nor the left common iliac artery are hemodynamically significant, as evident by the rapid speed of the contrast down the left leg.  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  COMPLICATIONS:  none  CONDITION: stable  Thomas SakeBrian Ngoc Detjen, MD [facility] [place] Office: [phone] Pager: [phone]  08/18/2013, 2:40 PM

## 2013-08-18 NOTE — Interval H&P Note (Signed)
[facility]  History and Physical Update  The patient was interviewed and re-examined.  The patient's previous History and Physical has been reviewed and is unchanged from my consult on: 08/15/13.  There is no change in the plan of care: aortogram, bilateral leg runoff, and possible left leg intervention.  I discussed with the patient the nature of angiographic procedures, especially the limited patencies of any endovascular intervention.  The patient is aware of that the risks of an angiographic procedure include but are not limited to: bleeding, infection, access site complications, renal failure, embolization, rupture of vessel, dissection, possible need for emergent surgical intervention, possible need for surgical procedures to treat the patient's pathology, anaphylactic reaction to contrast, and stroke and death.  The patient is aware of the risks and agrees to proceed.  Leonides SakeBrian Chen, MD [facility] [place] Office: [phone] Pager: [phone]  08/18/2013, 11:47 AM

## 2013-08-29 ENCOUNTER — Ambulatory Visit: Payer: Medicare Other | Admitting: Vascular Surgery

## 2013-08-29 ENCOUNTER — Other Ambulatory Visit (HOSPITAL_COMMUNITY): Payer: Medicare Other

## 2013-08-29 ENCOUNTER — Encounter (HOSPITAL_COMMUNITY): Payer: Medicare Other

## 2013-09-01 ENCOUNTER — Telehealth: Payer: Self-pay | Admitting: Vascular Surgery

## 2013-09-26 ENCOUNTER — Encounter (HOSPITAL_COMMUNITY): Payer: Medicare Other

## 2013-09-26 ENCOUNTER — Other Ambulatory Visit (HOSPITAL_COMMUNITY): Payer: Medicare Other

## 2013-09-26 ENCOUNTER — Ambulatory Visit: Payer: Medicare Other | Admitting: Vascular Surgery

## 2013-10-01 NOTE — Telephone Encounter (Signed)
Thomas Roth stepdaughter called to let us know he passed away this morning September 04, 2013. He was an appointment coming up that we need to cancel. It is on 09/26/13.

## 2013-10-01 DEATH — deceased

## 2014-03-12 ENCOUNTER — Encounter (HOSPITAL_COMMUNITY): Payer: Self-pay | Admitting: Vascular Surgery

## 2014-07-28 IMAGING — CR DG FOOT COMPLETE 3+V*R*
3 series · 3 of 3 positions shown · non-contrast
Comparison: None.

CLINICAL DATA: Right foot pain for 3 days. Diffuse foot swelling
and discoloration.

EXAM:
RIGHT FOOT COMPLETE - 3+ VIEW

[view not recorded (1 of 3)]
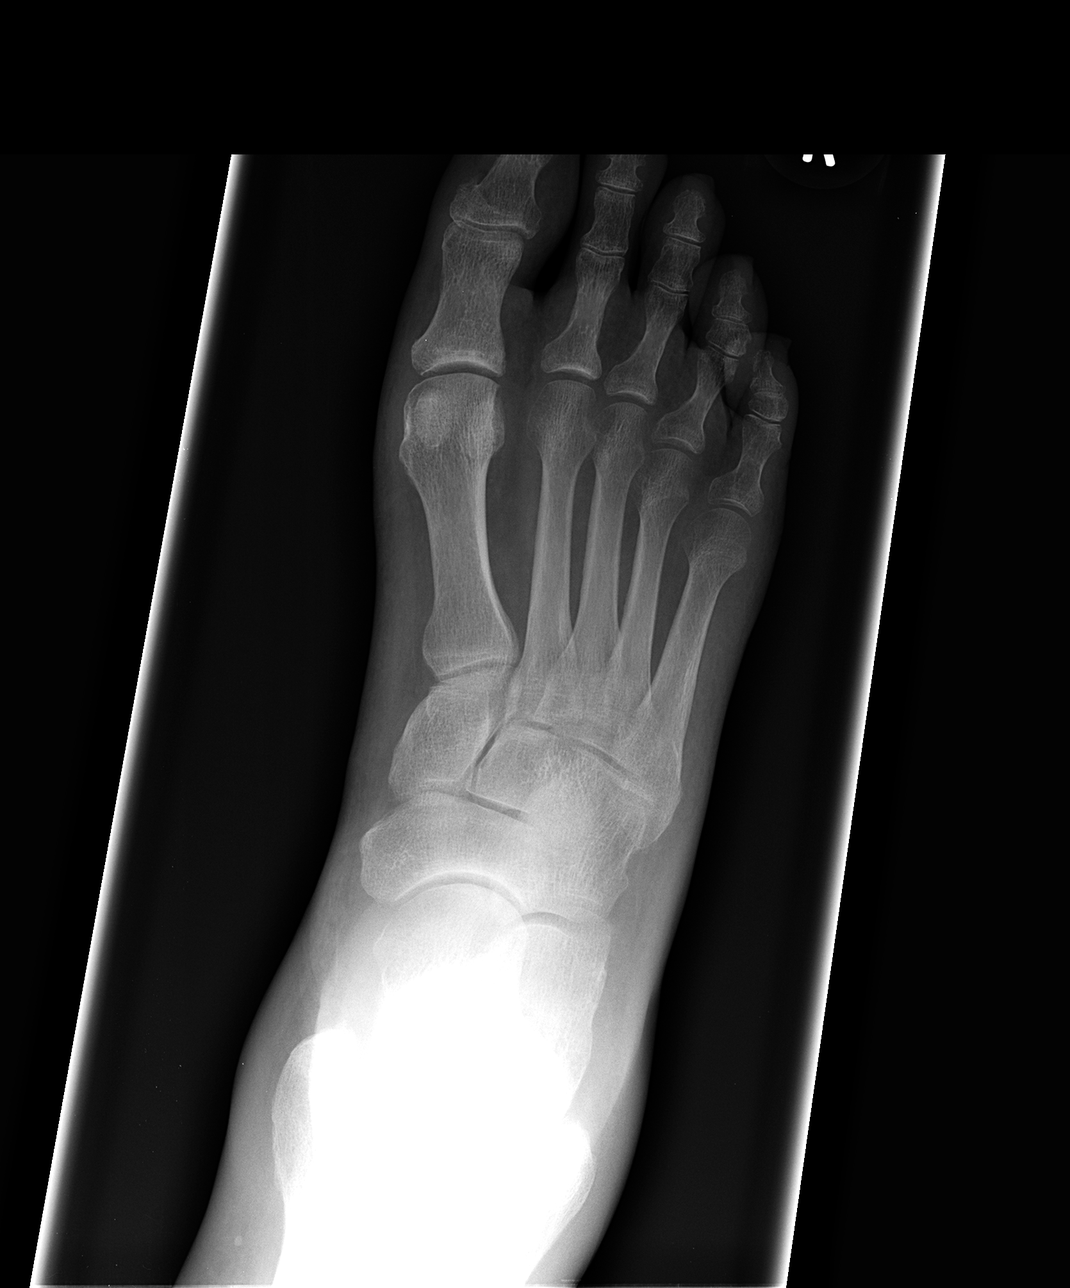

[view not recorded (2 of 3)]
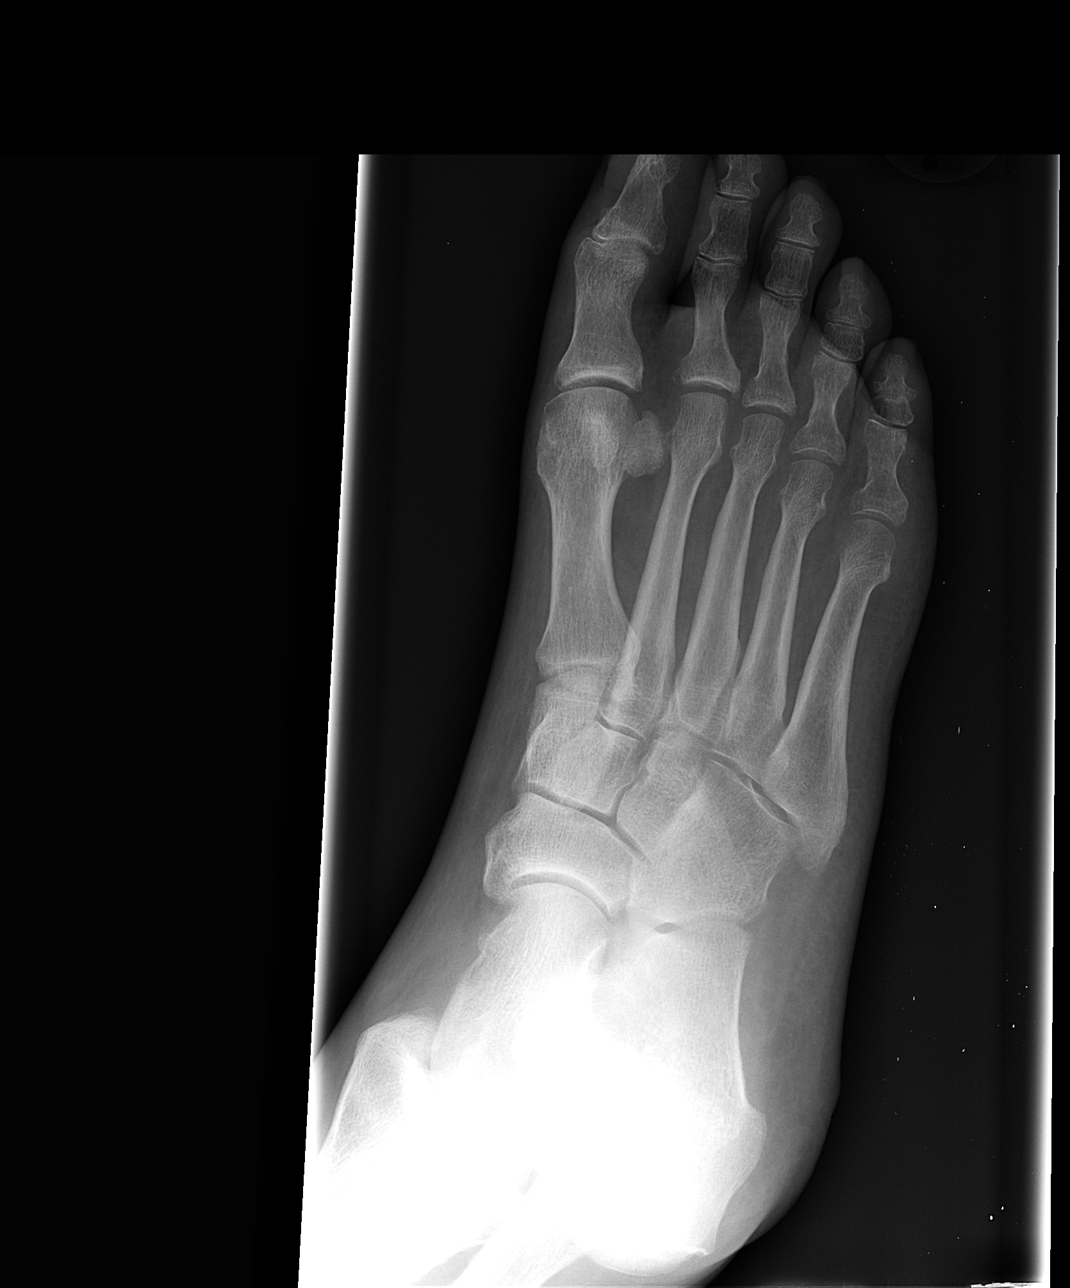

[view not recorded (3 of 3)]
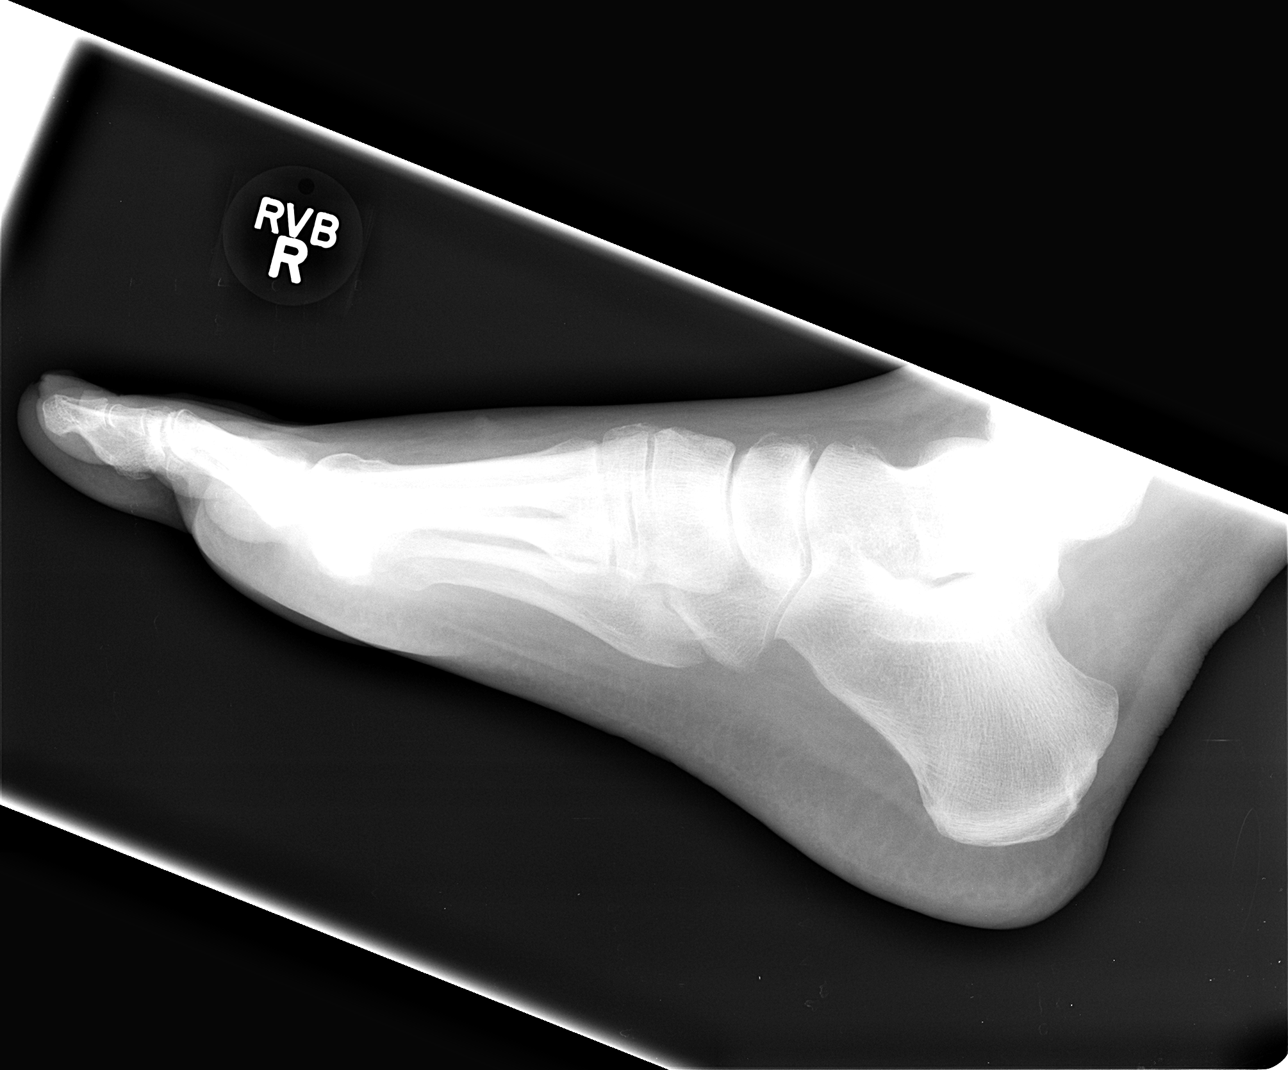

[3 of 3 positions shown; findings below may reference images not displayed]

FINDINGS: There is no evidence of fracture or dislocation. No osseous erosions
are seen to suggest osteomyelitis at this time. The joint spaces are
preserved. There is no evidence of talar subluxation; the subtalar
joint is unremarkable in appearance.

No significant soft tissue abnormalities are seen.
IMPRESSION: No evidence of fracture or dislocation.  No osseous erosions seen.
